# Patient Record
Sex: Female | Born: 1945
Health system: Southern US, Community
[De-identification: ages and names within clinical notes are randomized; demographics above are authoritative.]

## PROBLEM LIST (undated history)

## (undated) DIAGNOSIS — H269 Unspecified cataract: Secondary | ICD-10-CM

## (undated) DIAGNOSIS — R51 Headache: Secondary | ICD-10-CM

## (undated) DIAGNOSIS — M1990 Unspecified osteoarthritis, unspecified site: Secondary | ICD-10-CM

## (undated) DIAGNOSIS — R112 Nausea with vomiting, unspecified: Secondary | ICD-10-CM

## (undated) DIAGNOSIS — Z9889 Other specified postprocedural states: Secondary | ICD-10-CM

## (undated) DIAGNOSIS — H3530 Unspecified macular degeneration: Secondary | ICD-10-CM

## (undated) DIAGNOSIS — D649 Anemia, unspecified: Secondary | ICD-10-CM

## (undated) DIAGNOSIS — T4145XA Adverse effect of unspecified anesthetic, initial encounter: Secondary | ICD-10-CM

## (undated) DIAGNOSIS — M5030 Other cervical disc degeneration, unspecified cervical region: Secondary | ICD-10-CM

## (undated) DIAGNOSIS — K219 Gastro-esophageal reflux disease without esophagitis: Secondary | ICD-10-CM

## (undated) DIAGNOSIS — M810 Age-related osteoporosis without current pathological fracture: Secondary | ICD-10-CM

## (undated) DIAGNOSIS — T7840XA Allergy, unspecified, initial encounter: Secondary | ICD-10-CM

## (undated) DIAGNOSIS — F32A Depression, unspecified: Secondary | ICD-10-CM

## (undated) DIAGNOSIS — M5430 Sciatica, unspecified side: Secondary | ICD-10-CM

## (undated) DIAGNOSIS — F419 Anxiety disorder, unspecified: Secondary | ICD-10-CM

## (undated) DIAGNOSIS — G5602 Carpal tunnel syndrome, left upper limb: Secondary | ICD-10-CM

## (undated) HISTORY — DX: Allergy, unspecified, initial encounter: T78.40XA

## (undated) HISTORY — DX: Unspecified cataract: H26.9

## (undated) HISTORY — DX: Unspecified osteoarthritis, unspecified site: M19.90

## (undated) HISTORY — DX: Anemia, unspecified: D64.9

## (undated) HISTORY — DX: Gastro-esophageal reflux disease without esophagitis: K21.9

## (undated) HISTORY — DX: Depression, unspecified: F32.A

## (undated) HISTORY — DX: Unspecified macular degeneration: H35.30

## (undated) HISTORY — DX: Age-related osteoporosis without current pathological fracture: M81.0

## (undated) HISTORY — DX: Anxiety disorder, unspecified: F41.9

---

## 1974-05-03 HISTORY — PX: TUBAL LIGATION: SHX77

## 1999-09-15 ENCOUNTER — Ambulatory Visit (HOSPITAL_COMMUNITY): Admission: RE | Admit: 1999-09-15 | Discharge: 1999-09-15 | Payer: Self-pay | Admitting: Orthopaedic Surgery

## 2001-10-26 ENCOUNTER — Ambulatory Visit (HOSPITAL_COMMUNITY): Admission: RE | Admit: 2001-10-26 | Discharge: 2001-10-26 | Payer: Self-pay | Admitting: Obstetrics & Gynecology

## 2001-10-26 ENCOUNTER — Encounter: Payer: Self-pay | Admitting: Obstetrics & Gynecology

## 2001-10-27 ENCOUNTER — Ambulatory Visit (HOSPITAL_COMMUNITY): Admission: RE | Admit: 2001-10-27 | Discharge: 2001-10-27 | Payer: Self-pay | Admitting: Obstetrics & Gynecology

## 2001-10-27 ENCOUNTER — Encounter: Payer: Self-pay | Admitting: Obstetrics & Gynecology

## 2002-08-31 ENCOUNTER — Encounter: Payer: Self-pay | Admitting: Emergency Medicine

## 2002-08-31 ENCOUNTER — Emergency Department (HOSPITAL_COMMUNITY): Admission: EM | Admit: 2002-08-31 | Discharge: 2002-08-31 | Payer: Self-pay | Admitting: Emergency Medicine

## 2002-09-07 ENCOUNTER — Ambulatory Visit (HOSPITAL_COMMUNITY): Admission: RE | Admit: 2002-09-07 | Discharge: 2002-09-07 | Payer: Self-pay | Admitting: Obstetrics & Gynecology

## 2002-10-30 ENCOUNTER — Encounter: Payer: Self-pay | Admitting: Obstetrics & Gynecology

## 2002-10-30 ENCOUNTER — Ambulatory Visit (HOSPITAL_COMMUNITY): Admission: RE | Admit: 2002-10-30 | Discharge: 2002-10-30 | Payer: Self-pay | Admitting: Obstetrics & Gynecology

## 2003-10-31 ENCOUNTER — Ambulatory Visit (HOSPITAL_COMMUNITY): Admission: RE | Admit: 2003-10-31 | Discharge: 2003-10-31 | Payer: Self-pay | Admitting: Obstetrics & Gynecology

## 2004-05-08 ENCOUNTER — Ambulatory Visit (HOSPITAL_COMMUNITY): Admission: RE | Admit: 2004-05-08 | Discharge: 2004-05-08 | Payer: Self-pay | Admitting: Family Medicine

## 2004-10-28 ENCOUNTER — Encounter: Admission: RE | Admit: 2004-10-28 | Discharge: 2004-10-28 | Payer: Self-pay | Admitting: Orthopedic Surgery

## 2004-11-02 ENCOUNTER — Ambulatory Visit (HOSPITAL_COMMUNITY): Admission: RE | Admit: 2004-11-02 | Discharge: 2004-11-02 | Payer: Self-pay | Admitting: Obstetrics & Gynecology

## 2004-11-23 ENCOUNTER — Encounter: Admission: RE | Admit: 2004-11-23 | Discharge: 2004-11-23 | Payer: Self-pay | Admitting: Orthopedic Surgery

## 2005-02-10 ENCOUNTER — Encounter: Admission: RE | Admit: 2005-02-10 | Discharge: 2005-02-10 | Payer: Self-pay | Admitting: Orthopedic Surgery

## 2005-02-24 ENCOUNTER — Encounter: Admission: RE | Admit: 2005-02-24 | Discharge: 2005-02-24 | Payer: Self-pay | Admitting: Orthopedic Surgery

## 2005-03-09 ENCOUNTER — Observation Stay (HOSPITAL_COMMUNITY): Admission: AD | Admit: 2005-03-09 | Discharge: 2005-03-10 | Payer: Self-pay | Admitting: Family Medicine

## 2005-03-09 ENCOUNTER — Ambulatory Visit: Payer: Self-pay | Admitting: *Deleted

## 2005-09-03 ENCOUNTER — Ambulatory Visit (HOSPITAL_COMMUNITY): Admission: RE | Admit: 2005-09-03 | Discharge: 2005-09-03 | Payer: Self-pay | Admitting: Internal Medicine

## 2005-09-03 ENCOUNTER — Ambulatory Visit: Payer: Self-pay | Admitting: Internal Medicine

## 2005-11-04 ENCOUNTER — Ambulatory Visit (HOSPITAL_COMMUNITY): Admission: RE | Admit: 2005-11-04 | Discharge: 2005-11-04 | Payer: Self-pay | Admitting: Family Medicine

## 2006-11-17 ENCOUNTER — Ambulatory Visit (HOSPITAL_COMMUNITY): Admission: RE | Admit: 2006-11-17 | Discharge: 2006-11-17 | Payer: Self-pay | Admitting: Obstetrics & Gynecology

## 2007-05-30 ENCOUNTER — Inpatient Hospital Stay (HOSPITAL_COMMUNITY): Admission: EM | Admit: 2007-05-30 | Discharge: 2007-06-01 | Payer: Self-pay | Admitting: Emergency Medicine

## 2007-05-30 ENCOUNTER — Encounter (INDEPENDENT_AMBULATORY_CARE_PROVIDER_SITE_OTHER): Payer: Self-pay | Admitting: General Surgery

## 2007-10-16 ENCOUNTER — Encounter: Admission: RE | Admit: 2007-10-16 | Discharge: 2007-10-16 | Payer: Self-pay | Admitting: Orthopedic Surgery

## 2007-10-26 ENCOUNTER — Encounter: Admission: RE | Admit: 2007-10-26 | Discharge: 2007-10-26 | Payer: Self-pay | Admitting: Orthopedic Surgery

## 2007-11-21 ENCOUNTER — Ambulatory Visit (HOSPITAL_COMMUNITY): Admission: RE | Admit: 2007-11-21 | Discharge: 2007-11-21 | Payer: Self-pay | Admitting: Obstetrics & Gynecology

## 2007-11-24 ENCOUNTER — Encounter: Admission: RE | Admit: 2007-11-24 | Discharge: 2007-11-24 | Payer: Self-pay | Admitting: Neurosurgery

## 2008-05-03 HISTORY — PX: CARPAL TUNNEL RELEASE: SHX101

## 2008-05-03 HISTORY — PX: APPENDECTOMY: SHX54

## 2008-07-15 ENCOUNTER — Ambulatory Visit (HOSPITAL_COMMUNITY): Admission: RE | Admit: 2008-07-15 | Discharge: 2008-07-15 | Payer: Self-pay | Admitting: Family Medicine

## 2008-12-26 ENCOUNTER — Other Ambulatory Visit: Admission: RE | Admit: 2008-12-26 | Discharge: 2008-12-26 | Payer: Self-pay | Admitting: Obstetrics & Gynecology

## 2008-12-28 ENCOUNTER — Emergency Department (HOSPITAL_COMMUNITY): Admission: EM | Admit: 2008-12-28 | Discharge: 2008-12-28 | Payer: Self-pay | Admitting: Emergency Medicine

## 2009-05-03 HISTORY — PX: EYE SURGERY: SHX253

## 2010-05-24 ENCOUNTER — Encounter: Payer: Self-pay | Admitting: Orthopedic Surgery

## 2010-08-08 LAB — URINE CULTURE: Colony Count: 50000

## 2010-08-08 LAB — CBC
HCT: 39 % (ref 36.0–46.0)
Hemoglobin: 13.6 g/dL (ref 12.0–15.0)
RBC: 4.24 MIL/uL (ref 3.87–5.11)

## 2010-08-08 LAB — LIPASE, BLOOD: Lipase: 24 U/L (ref 11–59)

## 2010-08-08 LAB — DIFFERENTIAL
Eosinophils Relative: 1 % (ref 0–5)
Lymphocytes Relative: 43 % (ref 12–46)
Monocytes Absolute: 0.7 10*3/uL (ref 0.1–1.0)
Monocytes Relative: 11 % (ref 3–12)
Neutro Abs: 2.7 10*3/uL (ref 1.7–7.7)

## 2010-08-08 LAB — URINALYSIS, ROUTINE W REFLEX MICROSCOPIC
Bilirubin Urine: NEGATIVE
Hgb urine dipstick: NEGATIVE
Ketones, ur: NEGATIVE mg/dL
Nitrite: NEGATIVE
Specific Gravity, Urine: 1.015 (ref 1.005–1.030)
Urobilinogen, UA: 0.2 mg/dL (ref 0.0–1.0)

## 2010-08-08 LAB — HEPATIC FUNCTION PANEL
Albumin: 4.3 g/dL (ref 3.5–5.2)
Total Bilirubin: 0.5 mg/dL (ref 0.3–1.2)
Total Protein: 6.8 g/dL (ref 6.0–8.3)

## 2010-08-08 LAB — BASIC METABOLIC PANEL
CO2: 31 mEq/L (ref 19–32)
Calcium: 9.6 mg/dL (ref 8.4–10.5)
GFR calc Af Amer: >60 mL/min (ref >60)
GFR calc non Af Amer: >60 mL/min (ref >60)
Potassium: 4.2 mEq/L (ref 3.5–5.1)
Sodium: 138 mEq/L (ref 135–145)

## 2010-08-08 LAB — URINE MICROSCOPIC-ADD ON

## 2010-09-15 NOTE — Op Note (Signed)
Ariel Cook, Ariel Cook                ACCOUNT NO.:  [id number]   MEDICAL RECORD NO.:  [id number]          PATIENT TYPE:  INP   LOCATION:  A307                          FACILITY:  [facility]   PHYSICIAN:  Barbaraann Barthel, M.D. DATE OF BIRTH:  November 03, 1945   DATE OF PROCEDURE:  05/30/2007  DATE OF DISCHARGE:                               OPERATIVE REPORT   SURGEON:  Barbaraann Barthel, M.D.   PRE AND POSTOPERATIVE DIAGNOSIS:  Acute appendicitis.   PROCEDURE:  Open appendectomy.   SPECIMEN:  Appendix.   NOTE:  This is 65 year old white female who presented to the hospital at  the emergency room with less than 12-hour history of nausea and right  lower quadrant pain.  CT scan revealed acute appendicitis.  We took her  to surgery after discussing complications not limited to but including  bleeding, infection and appendiceal leak.  Informed consent was  obtained.   GROSS OPERATIVE FINDINGS:  The patient had the terminal portion of the  appendix appeared to be suppurative and it was nonperforated.  It was  adhered to the lateral aspect of the cecum.  The terminal ileum appeared  to be normal and the right lower quadrant otherwise was appeared to be  normal.   TECHNIQUE:  The patient was placed in supine position.  After the  adequate administration of general anesthesia via endotracheal  intubation a Foley catheter was aseptically inserted and a low  transverse incision was carried out over the right lower quadrant.  The  skin, subcutaneous tissue was excised, the fascia was excised over the  muscle.  The muscle was retracted medially and the peritoneum was  grasped with a two clamps and opened and the appendix was delivered into  the wound after dissecting it free from the cecum.  The adhesions around  it were carefully teased away from the cecum in order to avoid any  injury.  The mesoappendix was ligated with 2-0 silk and 3-0 Polysorb as  needed and silver clipped as well.  The appendix was  then amputated with  a TA-30 stapling device.  The right lower quadrant was then irrigated.  Peritoneum was closed with a running 0-0 Polysorb suture and then the  fascia was closed with interrupted figure-of-eight. Subcu was irrigated,  the skin was approximated with stapling device.  Prior to closing the  skin, I anesthetize the skin with approximately 10 mL of 0.5%  Sensorcaine to help with postoperative  comfort.  Prior to closure all sponge, needle, instrument counts found  to be correct.  Estimated blood loss was minimal.  The patient received  1100 mL of crystalloids intraoperatively.  No drains were placed.  There  were no complications.      Barbaraann Barthel, M.D.  Electronically Signed     WB/MEDQ  D:  05/30/2007  T:  05/30/2007  Job:  [id number]

## 2010-09-15 NOTE — Consult Note (Signed)
Ariel Cook, Ariel Cook                ACCOUNT NO.:  [id number]   MEDICAL RECORD NO.:  [id number]          PATIENT TYPE:  INP   LOCATION:  A307                          FACILITY:  [facility]   PHYSICIAN:  Barbaraann Barthel, M.D. DATE OF BIRTH:  1946/03/14   DATE OF CONSULTATION:  DATE OF DISCHARGE:                                 CONSULTATION   REASON FOR CONSULTATION:  Surgery was asked to see this 65 year old  white female with right lower quadrant pain.   HISTORY OF PRESENT MEDICAL ILLNESS:  She developed some right lower  quadrant pain approximately 5:00 or 5:30 in the afternoon of May 29, 2007.  This increased and she had considerable nausea.  She came to the  emergency room around 9:00 p.m., and Surgery was called around 1:30.  As  this patient had less than 12-hour history of pain, a normal white  count, and significant history of asthma in the past with difficulties  recovering from surgery due to her breathing problems, I placed her on  antibiotics with plans for surgery as soon as possible in the morning  with anesthesia available.  We discussed the surgery in detail with the  patient discussing complications not limited to but including bleeding,  infection, and appendiceal stump leak.  We also discussed the  possibility that a drain may be required.  Informed consent was  obtained.   PHYSICAL EXAMINATION:  VITAL SIGNS:  The patient is 5 feet tall and  weighs 150 pounds.  Her temperature is 99, heart rate 93 per minute,  respirations 18, blood pressure 118/71.  HEENT:  Head is normocephalic.  Eyes:  Extraocular movements intact.  Pupils were round and react to light and accommodation.  There was no  conjunctive pallor or scleral injection.  Sclerae are of normal  tincture.  NECK:  Cylindrical.  There is no jugular vein distention, thyromegaly,  tracheal deviation, and no bruits are auscultated.  LUNGS:  Chest is clear.  I do not hear any wheezing.  HEART:  Regular rhythm.  CHEST:  Breasts and axilla are without masses.  ABDOMEN:  Bowel sounds are diminished with exquisite tenderness in the  right lower quadrant with localized rebound.  No femoral or inguinal  hernias are appreciated.  RECTAL:  The stool is guaiac negative.  PELVIC:  Pelvic was deferred.  EXTREMITIES:  Within normal limits.   REVIEW OF SYSTEMS:  OB/GYN HISTORY:  Gravida 2, para 2, abortus 0,  cesarean 0 family.  FAMILY HISTORY:  Breast carcinoma, her sister had carcinoma of breast.  She has followed  closely with her mammograms and in 2008, her last mammogram was  negative.  PAST SURGICAL HISTORY:  History of tubal ligation and endometrial ablation procedure.  GU  HISTORY:  History of urinary tract infections in the past.  No history  of nephrolithiasis.  CARDIORESPIRATORY SYSTEM:  The patient is a  nonsmoker.  She does, however, have a history of asthma and takes an  albuterol inhaler.  It has been sometime since she has had an asthma  attack, and she states that she had problems  after surgery, breathing  problems, this irritated her asthma.  ENDOCRINE HISTORY:  No history of  diabetes or thyroid disease. MUSCULOSKELETAL SYSTEM:  She has  degenerative spine disease affecting the cervical spine and the back.  GI SYSTEM:  Nausea, right lower quadrant pain.  No bright red rectal  bleeding, black tarry stools, or change in bowel habits.  No history of  inflammatory bowel disease or hepatitis.   MEDICATIONS:  She takes a vitamin supplements and albuterol inhaler on a  p.r.n. basis.   ALLERGIES:  SHE IS ALLERGIC TO CODEINE AND SULFA.  THESE CAUSE RASHES  AND BREATHING PROBLEMS.   LABORATORY DATA:  CT shows a 10 mm appendix with inflammatory changes  consistent with acute appendicitis with no rupture.  Her white count is  10,000 with an H&H of 12.9 and 37.9.  She has 76% neutrophils.  Urinalysis is grossly within normal limits and metabolic-7 is likewise  within normal limits.   PAST  SURGERIES:  The patient has had cataract surgery, tubal ligation,  endocrine ablation, and carpal tunnel on the right wrist.   IMPRESSION:  Ariel Cook is a 65 year old white female with who has a  history of asthma who presents with signs and symptoms of acute  appendicitis, and we will plan for a surgery as soon as possible.  We  have discussed this in detail with both her and her sister.  Informed  consent was obtained.      Barbaraann Barthel, M.D.  Electronically Signed     WB/MEDQ  D:  05/30/2007  T:  05/30/2007  Job:  [id number]   cc:   Barbaraann Barthel, M.D.  Fax: [phone]   Emergency Room

## 2010-09-15 NOTE — Discharge Summary (Signed)
[name], Ariel Cook                ACCOUNT NO.:  [id number]   MEDICAL RECORD NO.:  [id number]          PATIENT TYPE:  INP   LOCATION:  A307                          FACILITY:  [facility]   PHYSICIAN:  Barbaraann Barthel, M.D. DATE OF BIRTH:  May 26, 1945   DATE OF ADMISSION:  05/29/2007  DATE OF DISCHARGE:  01/29/2009LH                               DISCHARGE SUMMARY   DATE OF SURGERY:  May 30, 2007.   PROCEDURE:  Open appendectomy.   This is a 65 year old white female who presented to the hospital  emergency room with a less than 12-hour history of nausea and right  lower quadrant pain.  A CT scan revealed acute appendicitis.  We took  her to surgery after discussing the complications of the procedure.  We  found an acute suppurative, nonperforated appendix that was very adhered  to the cecum however, there was no perforation.  The patient tolerated  the procedure well and she was discharged on essentially the second and  a half postoperative day.  At the time of discharge, her wound was  clean.  She was tolerating p.o. and she was moving her bowels.  She had  no leg pain, dysuria or shortness of breath.  Her hospital course was  completely benign.  Her diet and activity were advanced as tolerated and  she did well.  She was discharged without any problems essentially on  the second postoperative day.   LABORATORY DATA:  She was admitted with an H&H of 10.5 and an H&H of  12.9 and 37.4 with 76% neutrophils and her white count on May 31, 2007 was 8.5 with an H&H of 11.8 and 33.9.  Her electrolytes were within  normal limits.   CT scan results were as mentioned above.   Her hospital course was completely benign.  Her discharge instructions  are as follows.  She is to resume her preoperative medications.  She is  discharged on a full liquid and soft diet.  She is told clean her wound  with alcohol three times a day.  She is told not to take any harsh  laxatives.  She is to take no  aspirin.  1. Tylenol 2 tablets every 4 hours as needed for pain.  2. Colace 100 mg daily two times a day.  3. She is to continue take Cipro 500 mg two times a day p.o. for 3      days.  4. She is told to resume her preoperative medications.   We will follow her up in the office on Thursday, February 5, I believe  at 10:00 a.m. and she is told to call should she have any acute problems  or go to the emergency room.     Barbaraann Barthel, M.D.  Electronically Signed    WB/MEDQ  D:  06/01/2007  T:  06/02/2007  Job:  [id number]

## 2010-09-15 NOTE — Discharge Summary (Signed)
[name], [name]                ACCOUNT NO.:  [id number]   MEDICAL RECORD NO.:  [id number]          PATIENT TYPE:  INP   LOCATION:  A307                          FACILITY:  [facility]   PHYSICIAN:  Barbaraann Barthel, M.D. DATE OF BIRTH:  01-10-1946   DATE OF ADMISSION:  05/29/2007  DATE OF DISCHARGE:  01/29/2009LH                               DISCHARGE SUMMARY   DIAGNOSIS:  Acute appendicitis.   PROCEDURE:  On May 30, 2007 open appendectomy.   NOTE:  This is a 65 year old white female who was admitted through the  emergency room with signs and symptoms of acute appendicitis.   Dictation ended at this point.      Barbaraann Barthel, M.D.     WB/MEDQ  D:  06/01/2007  T:  06/02/2007  Job:  [id number]

## 2010-09-18 NOTE — Procedures (Signed)
[name], Ariel Cook                ACCOUNT NO.:  [id number]   MEDICAL RECORD NO.:  [id number]          PATIENT TYPE:  OBV   LOCATION:  A227                          FACILITY:  [facility]   PHYSICIAN:  Donna Bernard, M.D.DATE OF BIRTH:  24-Jul-1945   DATE OF PROCEDURE:  03/09/2005  DATE OF DISCHARGE:  03/10/2005                                EKG INTERPRETATION   Electrocardiogram reveals normal sinus rhythm with no significant ST-T  changes.      Donna Bernard, M.D.  Electronically Signed     WSL/MEDQ  D:  05/10/2005  T:  05/11/2005  Job:  [id number]

## 2010-09-18 NOTE — Op Note (Signed)
   NAME:  Ariel Cook, Ariel Cook                          ACCOUNT NO.:  [id number]   MEDICAL RECORD NO.:  [id number]                   PATIENT TYPE:  AMB   LOCATION:  DAY                                  FACILITY:  [facility]   PHYSICIAN:  Lazaro Arms, M.D.                DATE OF BIRTH:  January 13, 1946   DATE OF PROCEDURE:  09/07/2002  DATE OF DISCHARGE:                                 OPERATIVE REPORT   PREOPERATIVE DIAGNOSIS:  Menometrorrhagia.   POSTOPERATIVE DIAGNOSIS:  Menometrorrhagia.   PROCEDURE:  Hysteroscopy, D&C, with endometrial ablation.   SURGEON:  Lazaro Arms, M.D.   ANESTHESIA:  General endotracheal.   FINDINGS:  The patient came to the office complaining of prolonged vaginal  bleeding for 19 straight days.  She is 56, almost 65 years old and has been  having regular periods.  Because of the concern of her perimenopausal  status, we proceeded with a hysteroscopy, D&C, and endometrial ablation.  The findings were negative.  There were no abnormalities in the endometrium  whatsoever.   DESCRIPTION OF PROCEDURE:  The patient was taken to the operating room and  placed in the supine position where she underwent general endotracheal  anesthesia.  She was placed in the low lithotomy position and prepped and  draped in the usual sterile fashion.  The bladder was drained.   A sterile speculum was placed.  Her cervix was grasped with a single-tooth  tenaculum, and 0.5% Marcaine with 1:200,000 epinephrine was given as a  paracervical block.  The cervix was dilated serially.  The uterus sounded to  9 cm.  Hysteroscopy was performed and was found to be normal.  The uterus  was curetted vigorously, with uterine cry in all areas.  We then performed  endometrial ablation using the ThermaChoice catheter.  We took it to a  negative pressure of -150.  We then used approximately 16 cc of D5W and  distended the balloon to a pressure of 175 mmHg to heat it up to 87 degrees  Celsius for a total  therapy time of 9 minutes and 18 seconds.  The patient  tolerated the procedure well.  She experienced minimal blood loss and was  taken to the recovery room in good and stable condition.  All counts were  correct.                                               Lazaro Arms, M.D.    Loraine Maple  D:  09/07/2002  T:  09/07/2002  Job:  [id number]

## 2010-09-18 NOTE — Op Note (Signed)
Ariel Cook, Ariel Cook                ACCOUNT NO.:  [id number]   MEDICAL RECORD NO.:  [id number]          PATIENT TYPE:  AMB   LOCATION:  DAY                           FACILITY:  [facility]   PHYSICIAN:  Lionel December, M.D.    DATE OF BIRTH:  March 30, 1946   DATE OF PROCEDURE:  09/03/2005  DATE OF DISCHARGE:                                 OPERATIVE REPORT   PROCEDURE:  Colonoscopy.   INDICATIONS:  [name] is 65 year old Caucasian female who is undergoing  average risk screening colonoscopy.  The procedure is reviewed with the  patient and informed consent was obtained.   MEDS FOR CONSCIOUS SEDATION:  Fentanyl 25 mcg IV, Versed 4 mg IV.   FINDINGS:  The procedure was performed in the endoscopy suite.  The  patient's vital signs and O2 sat were monitored during procedure and  remained stable.  The patient was placed in the left lateral position and  rectal examination performed.  No abnormality noted on external or digital  exam.  The Olympus videoscope was placed in the rectum and advanced under  vision in the sigmoid colon and beyond.  The preparation was excellent.  The  scope was passed into cecum which was identified by appendiceal orifice and  ileocecal valve.  As the scope was withdrawn, the colonic mucosa was  carefully examined and was normal throughout.  Rectal mucosa, similarly, was  normal.  The scope was retroflexed to examine anorectal junction which was  unremarkable.  The endoscope was then straightened and withdrawn.  The  patient tolerated the procedure well.   FINAL DIAGNOSIS:  Normal colonoscopy.   RECOMMENDATIONS:  1.  She will resume her usual medicine diet.  2.  Y early Hemoccults and she may consider next screening exam in ten years      from now.      Lionel December, M.D.  Electronically Signed     NR/MEDQ  D:  09/03/2005  T:  09/03/2005  Job:  [id number]   cc:   Donna Bernard, M.D.  Fax: [phone]

## 2010-09-18 NOTE — Consult Note (Signed)
Ariel Cook, Ariel Cook                ACCOUNT NO.:  [id number]   MEDICAL RECORD NO.:  [id number]          PATIENT TYPE:  OBV   LOCATION:  A227                          FACILITY:  [facility]   PHYSICIAN:  Cecil Cranker, M.D.DATE OF BIRTH:  November 17, 1945   DATE OF CONSULTATION:  03/09/2005  DATE OF DISCHARGE:                                   CONSULTATION   REFERRING PHYSICIAN:  Donna Bernard, M.D.   HISTORY OF PRESENT ILLNESS:  Ariel Cook is a 65 year old female with past  medical history significant for asthma and chronic back problems who reports  onset of severe anterior chest discomfort at rest today.  She states she was  at her lawyer's office standing and talking with someone when she noted  gradual onset of severe pressure in her anterior chest that radiated up into  her neck bilaterally.  She denied any associated shortness of breath,  nausea, vomiting or diaphoresis.  She denied any alleviating or aggravating  factors.  She reports the pain gradually eased off once she reached Dr.  Fletcher Anon office.  She does report one previous episode of similar chest  discomfort several years ago.  She was evaluated in the emergency department  and released.  She does report chronic dyspnea on exertion that she relates  to her asthma.  She also reports occasional mild chest discomfort when she  is walking.   PAST MEDICAL HISTORY:  1.  Asthma since age 7 with no previous hospitalizations for this.  2.  Status post D&C and endometrial ablation in 2004.  3.  Status post partial hysterectomy.  4.  History of chronic back pain secondary to three bulging discs and one      disintegrated disc.  5.  She has received several epidural steroid injections for treatment.  6.  Status post cataract removal bilaterally.  7.  Status post bilateral tubal ligation in 1976.  8.  History of scoliosis.   ALLERGIES:  CODEINE.  SULFA.  Question of PAIN MEDICINE.   MEDICATIONS PRIOR TO ADMISSION:  1.   Albuterol as needed.  2.  Tylenol PM nightly.   SOCIAL HISTORY:  Ariel Cook lives in [place] with her husband.  She is  married.  She has two sons, one with cerebral palsy and lives in a group  home and one who lives in [place], [place], and has two children of  his own.  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  She denies any history of tobacco abuse.  No  alcohol use.  No illicit drug use.  She walks approximately 2 miles per day.  She is a vegetarian.   FAMILY HISTORY:  Mother died at 31 years old secondary to lung disease.  She  has a history of angina.  Father died at 18 years old secondary to MI.  She  has two sister living, one with a small hole in her heart, and one with  diabetes and hypertension.   REVIEW OF SYSTEMS:  CONSTITUTIONAL:  No fevers or chills.  HEENT:  She has  had some upper respiratory symptoms over the last several weeks including  sore throat and congestion.  She has occasional vertigo and she states she  is lightheaded all the time.  SKINS:  No rashes or lesions.  CARDIOPULMONARY:  As per HPI.  Positive for cough and wheezing x6 weeks.  Chronic peripheral edema and occasional palpitations.  She also reports  chronic orthopnea.  GU:  No frequency or dysuria.  NEUROLOGIC:  Numbness in  her left leg occasionally secondary to a back problem.  MUSCULOSKELETAL:  She states she has arthralgias all over, mostly in her back.  She gets  frequent cramps in her legs at night.  GASTROINTESTINAL:  No nausea,  vomiting or diarrhea.  No bright red blood per rectum.  No melena.  All of  the systems reviewed are negative except for as in HPI.   PHYSICAL EXAMINATION:  VITAL SIGNS:  Temperature 98.3, pulse 59,  respirations 19, blood pressure 122/73.  Weight 147 pounds.  GENERAL:  Well-developed, well-nourished female in no acute distress.  NECK:  No jugular venous distention, no carotid bruits are auscultated.  CARDIAC:   Regular rate and rhythm, normal S1, S2.  No murmurs are  appreciated.  LUNGS:  Clear to auscultation.  ABDOMEN:  Soft and nontender with active bowel sounds.  No  hepatosplenomegaly is noted.  EXTREMITIES:  Femoral pulses intact bilaterally.  No femoral bruits are  noted.  Distal pulses intact bilaterally.  Trace peripheral edema noted.  NEUROLOGIC:  She is alert and oriented x3, grossly nonfocal.  SKIN:  Warm and dry with no obvious rashes noted.   LABORATORY DATA AND X-RAY FINDINGS:  EKG reveals a sinus rhythm at 60 beats  per minute, normal PR interval, QSR and normal QTC.  She has some  nonspecific ST abnormalities.  Chest x-ray is also pending.   CBC reveals a white blood cell count of 7, hemoglobin 14.6, hematocrit 42.7,  platelets 341.  Metabolic panel is pending.  Cardiac enzymes are pending at  the time of this dictation.   IMPRESSION:  Chest discomfort with some typical features for coronary  ischemia concerning.   RECOMMENDATIONS:  The patient will have serial cardiac enzymes to rule out  acute myocardial infarction.  If her enzymes remain negative and she has no  further chest discomfort, she will be evaluated with a stress Myoview in the  morning.  If she has abnormal cardiac enzymes or return of her chest  discomfort, she will need to have further evaluation with cardiac  catheterization at [facility].   We appreciate this consult and will be happy to follow this patient along  with you.  The patient was interviewed and examined by Dr. Corinda Gubler and he  agrees with the above assessment and plan.      Amy Mercy Riding, P.A. [facility]    ______________________________  E. Graceann Congress, M.D.    AB/MEDQ  D:  03/09/2005  T:  03/09/2005  Job:  [id number]

## 2010-09-18 NOTE — H&P (Signed)
Ariel Cook, Ariel Cook                ACCOUNT NO.:  [id number]   MEDICAL RECORD NO.:  [id number]          PATIENT TYPE:  OBV   LOCATION:  A227                          FACILITY:  [facility]   PHYSICIAN:  Donna Bernard, M.D.DATE OF BIRTH:  [birth year]   DATE OF ADMISSION:  03/09/2005  DATE OF DISCHARGE:  LH                                HISTORY & PHYSICAL   CHIEF COMPLAINT:  Chest pain.   HISTORY OF PRESENT ILLNESS:  This patient is a 65 year old white female with  fairly benign prior medical history who presented to the office the day of  admission with complaints of severe chest pain.  She described this as  substernal, deep pressure, ache that was very severe in nature.  It radiated  to her throat.  She had no nausea, no shortness of breath, no diaphoresis.  She does have chronic asthma for which she uses Ventolin spray  intermittently for this although often the Ventolin spray triggers excessive  heart rate.  The patient has been under a lot of increased stress recently,  working on the probate of her father's estate.  In fact, she was in the  lawyer's office having a discussion with him about that when she developed  this discomfort.  The patient also notes symptoms of reflux intermittently.  In addition, she does exercise and usually does not get pain with this.  There is a family history of coronary artery disease, though, including her  father.   SOCIAL HISTORY:  The patient does not smoke.  She is married.  No  significant alcohol intake.   ALLERGIES:  Please see chart.   PAST SURGICAL HISTORY:  Remote endometrial ablation, remote cataract  surgery, epidural injections to the back recently via Dr. Charlett Blake.   CURRENT MEDICATIONS:  1.  Ventolin two sprays PRN.  2.  Tylenol PRN for headaches.   PAST MEDICAL HISTORY:  Significant for that noted above plus recurrent back  pain currently followed by a specialist for this.  Also patient notes  chronic migraine headaches for which  she uses Tylenol.  Patient is  experiencing headaches more frequently these days.   FAMILY HISTORY:  As noted, positive for coronary artery disease.   REVIEW OF SYSTEMS:  Otherwise negative.   PHYSICAL EXAMINATION:  VITAL SIGNS:  Blood pressure 136/80, afebrile.  GENERAL APPEARANCE:  No acute distress.  HEENT:  Normal.  NECK:  Supple.  LUNGS:  Clear.  HEART:  Regular rate and rhythm.  Chest wall nontender.  ABDOMEN:  Nontender.  EXTREMITIES:  Normal.  NEUROLOGICAL:  Intact.   CLINICAL DATA:  Electrocardiogram normal sinus rhythm.  No significant ST-T  changes.  Cardiac enzymes pending.   IMPRESSION:  1.  Chest pain, atypical in nature, however, with severity along with risk      factors, along with the nature of the pain, further evaluation is      warranted.  2.  Asthma.  3.  Migraine headaches.   PLAN:  Admit to hospital, serial cardiac enzymes, telemetry, cardiology  consultation, chest x-ray, Lovenox administration, further orders as noted  on  the chart.      Donna Bernard, M.D.  Electronically Signed     WSL/MEDQ  D:  03/10/2005  T:  03/10/2005  Job:  [id number]

## 2010-12-11 ENCOUNTER — Other Ambulatory Visit: Payer: Self-pay | Admitting: Ophthalmology

## 2011-01-21 LAB — CBC
HCT: 33.9 — ABNORMAL LOW
Hemoglobin: 11.8 — ABNORMAL LOW
Hemoglobin: 12.9
MCV: 93.8
MCV: 95.1
RBC: 3.57 — ABNORMAL LOW
RBC: 3.99
WBC: 10.5
WBC: 8.5

## 2011-01-21 LAB — DIFFERENTIAL
Eosinophils Absolute: 0
Eosinophils Absolute: 0.1
Eosinophils Relative: 0
Lymphs Abs: 1.8
Lymphs Abs: 1.9
Monocytes Relative: 5
Monocytes Relative: 8
Neutrophils Relative %: 76

## 2011-01-21 LAB — URINALYSIS, ROUTINE W REFLEX MICROSCOPIC
Glucose, UA: NEGATIVE
Hgb urine dipstick: NEGATIVE
Protein, ur: NEGATIVE
Specific Gravity, Urine: 1.01

## 2011-01-21 LAB — BASIC METABOLIC PANEL
Chloride: 102
Chloride: 111
Creatinine, Ser: 0.76
GFR calc Af Amer: >60
GFR calc Af Amer: >60
Potassium: 3.8
Potassium: 4.1
Sodium: 137

## 2011-01-21 LAB — LIPASE, BLOOD: Lipase: 24

## 2011-02-24 ENCOUNTER — Other Ambulatory Visit: Payer: Self-pay | Admitting: Family Medicine

## 2011-02-24 DIAGNOSIS — Z139 Encounter for screening, unspecified: Secondary | ICD-10-CM

## 2011-03-01 ENCOUNTER — Ambulatory Visit (HOSPITAL_COMMUNITY)
Admission: RE | Admit: 2011-03-01 | Discharge: 2011-03-01 | Disposition: A | Discharge status: Home / Self Care | Payer: BC Managed Care – PPO | Source: Ambulatory Visit | Attending: Family Medicine | Admitting: Family Medicine

## 2011-03-01 ENCOUNTER — Other Ambulatory Visit: Payer: Self-pay | Admitting: Family Medicine

## 2011-03-01 DIAGNOSIS — R05 Cough: Secondary | ICD-10-CM | POA: Insufficient documentation

## 2011-03-01 DIAGNOSIS — Z139 Encounter for screening, unspecified: Secondary | ICD-10-CM

## 2011-03-01 DIAGNOSIS — R059 Cough, unspecified: Secondary | ICD-10-CM | POA: Insufficient documentation

## 2011-03-01 DIAGNOSIS — Z1231 Encounter for screening mammogram for malignant neoplasm of breast: Secondary | ICD-10-CM | POA: Insufficient documentation

## 2011-03-01 DIAGNOSIS — R079 Chest pain, unspecified: Secondary | ICD-10-CM | POA: Insufficient documentation

## 2011-08-16 ENCOUNTER — Other Ambulatory Visit: Payer: Self-pay | Admitting: Orthopedic Surgery

## 2011-09-01 ENCOUNTER — Encounter (HOSPITAL_BASED_OUTPATIENT_CLINIC_OR_DEPARTMENT_OTHER): Payer: Self-pay | Admitting: *Deleted

## 2011-09-02 ENCOUNTER — Encounter (HOSPITAL_BASED_OUTPATIENT_CLINIC_OR_DEPARTMENT_OTHER): Payer: Self-pay | Admitting: *Deleted

## 2011-09-03 ENCOUNTER — Encounter (HOSPITAL_BASED_OUTPATIENT_CLINIC_OR_DEPARTMENT_OTHER): Payer: Self-pay | Admitting: Anesthesiology

## 2011-09-03 ENCOUNTER — Encounter (HOSPITAL_BASED_OUTPATIENT_CLINIC_OR_DEPARTMENT_OTHER): Payer: Self-pay | Admitting: *Deleted

## 2011-09-03 ENCOUNTER — Encounter (HOSPITAL_BASED_OUTPATIENT_CLINIC_OR_DEPARTMENT_OTHER)
Admission: RE | Disposition: A | Discharge status: Home / Self Care | Payer: Self-pay | Source: Ambulatory Visit | Attending: Orthopedic Surgery

## 2011-09-03 ENCOUNTER — Ambulatory Visit (HOSPITAL_BASED_OUTPATIENT_CLINIC_OR_DEPARTMENT_OTHER)
Admission: RE | Admit: 2011-09-03 | Discharge: 2011-09-03 | Disposition: A | Discharge status: Home / Self Care | Payer: BC Managed Care – PPO | Source: Ambulatory Visit | Attending: Orthopedic Surgery | Admitting: Orthopedic Surgery

## 2011-09-03 ENCOUNTER — Ambulatory Visit (HOSPITAL_BASED_OUTPATIENT_CLINIC_OR_DEPARTMENT_OTHER): Payer: BC Managed Care – PPO | Admitting: Anesthesiology

## 2011-09-03 DIAGNOSIS — K219 Gastro-esophageal reflux disease without esophagitis: Secondary | ICD-10-CM | POA: Insufficient documentation

## 2011-09-03 DIAGNOSIS — J45909 Unspecified asthma, uncomplicated: Secondary | ICD-10-CM | POA: Insufficient documentation

## 2011-09-03 DIAGNOSIS — R51 Headache: Secondary | ICD-10-CM | POA: Insufficient documentation

## 2011-09-03 DIAGNOSIS — G5602 Carpal tunnel syndrome, left upper limb: Secondary | ICD-10-CM

## 2011-09-03 DIAGNOSIS — M5030 Other cervical disc degeneration, unspecified cervical region: Secondary | ICD-10-CM | POA: Insufficient documentation

## 2011-09-03 DIAGNOSIS — G5600 Carpal tunnel syndrome, unspecified upper limb: Secondary | ICD-10-CM | POA: Insufficient documentation

## 2011-09-03 HISTORY — DX: Other cervical disc degeneration, unspecified cervical region: M50.30

## 2011-09-03 HISTORY — PX: CARPAL TUNNEL RELEASE: SHX101

## 2011-09-03 HISTORY — DX: Sciatica, unspecified side: M54.30

## 2011-09-03 HISTORY — DX: Nausea with vomiting, unspecified: R11.2

## 2011-09-03 HISTORY — DX: Other specified postprocedural states: Z98.890

## 2011-09-03 HISTORY — DX: Carpal tunnel syndrome, left upper limb: G56.02

## 2011-09-03 HISTORY — DX: Headache: R51

## 2011-09-03 HISTORY — DX: Adverse effect of unspecified anesthetic, initial encounter: T41.45XA

## 2011-09-03 SURGERY — CARPAL TUNNEL RELEASE
Anesthesia: General | Site: Wrist | Laterality: Left | Wound class: Clean

## 2011-09-03 MED ORDER — PROMETHAZINE HCL 25 MG PO TABS: 25.0000 mg | ORAL_TABLET | Freq: Four times a day (QID) | ORAL | Status: DC | PRN

## 2011-09-03 MED ORDER — OXYCODONE-ACETAMINOPHEN 5-325 MG PO TABS: 1.0000 | ORAL_TABLET | Freq: Four times a day (QID) | ORAL | Status: AC | PRN

## 2011-09-03 MED ORDER — FENTANYL CITRATE 0.05 MG/ML IJ SOLN
INTRAMUSCULAR | Status: DC | PRN
  Administered 2011-09-03: 50 ug via INTRAVENOUS

## 2011-09-03 MED ORDER — LIDOCAINE HCL (CARDIAC) 20 MG/ML IV SOLN
INTRAVENOUS | Status: DC | PRN
  Administered 2011-09-03: 30 mg via INTRAVENOUS

## 2011-09-03 MED ORDER — BUPIVACAINE HCL (PF) 0.5 % IJ SOLN
INTRAMUSCULAR | Status: DC | PRN
  Administered 2011-09-03: 10 mL

## 2011-09-03 MED ORDER — PROPOFOL 10 MG/ML IV EMUL
INTRAVENOUS | Status: DC | PRN
  Administered 2011-09-03: 50 mg via INTRAVENOUS
  Administered 2011-09-03: 150 mg via INTRAVENOUS

## 2011-09-03 MED ORDER — MIDAZOLAM HCL 5 MG/5ML IJ SOLN
INTRAMUSCULAR | Status: DC | PRN
  Administered 2011-09-03: 1 mg via INTRAVENOUS

## 2011-09-03 MED ORDER — ACETAMINOPHEN 10 MG/ML IV SOLN
1000.0000 mg | Freq: Once | INTRAVENOUS | Status: AC
  Administered 2011-09-03: 1000 mg via INTRAVENOUS

## 2011-09-03 MED ORDER — ONDANSETRON HCL 4 MG/2ML IJ SOLN
INTRAMUSCULAR | Status: DC | PRN
  Administered 2011-09-03: 4 mg via INTRAVENOUS

## 2011-09-03 MED ORDER — LACTATED RINGERS IV SOLN
INTRAVENOUS | Status: DC
  Administered 2011-09-03 (×3): via INTRAVENOUS

## 2011-09-03 MED ORDER — FENTANYL CITRATE 0.05 MG/ML IJ SOLN
25.0000 ug | INTRAMUSCULAR | Status: DC | PRN
  Administered 2011-09-03: 25 ug via INTRAVENOUS
  Administered 2011-09-03: 50 ug via INTRAVENOUS

## 2011-09-03 MED ORDER — CEFAZOLIN SODIUM 1-5 GM-% IV SOLN: 1.0000 g | INTRAVENOUS | Status: DC

## 2011-09-03 MED ORDER — DEXAMETHASONE SODIUM PHOSPHATE 4 MG/ML IJ SOLN
INTRAMUSCULAR | Status: DC | PRN
  Administered 2011-09-03: 10 mg via INTRAVENOUS

## 2011-09-03 SURGICAL SUPPLY — 41 items
BANDAGE ELASTIC 3 VELCRO ST LF (GAUZE/BANDAGES/DRESSINGS) ×2 IMPLANT
BLADE SURG 15 STRL LF DISP TIS (BLADE) ×1 IMPLANT
BLADE SURG 15 STRL SS (BLADE) ×2
BNDG CMPR 9X4 STRL LF SNTH (GAUZE/BANDAGES/DRESSINGS) ×1
BNDG ESMARK 4X9 LF (GAUZE/BANDAGES/DRESSINGS) ×1 IMPLANT
CLOTH BEACON ORANGE TIMEOUT ST (SAFETY) ×1 IMPLANT
CORDS BIPOLAR (ELECTRODE) ×2 IMPLANT
COVER TABLE BACK 60X90 (DRAPES) ×2 IMPLANT
CUFF TOURNIQUET SINGLE 18IN (TOURNIQUET CUFF) ×1 IMPLANT
DRAPE EXTREMITY T 121X128X90 (DRAPE) ×2 IMPLANT
DRAPE SURG 17X23 STRL (DRAPES) ×2 IMPLANT
DRAPE U 20/CS (DRAPES) ×2 IMPLANT
DURAPREP 26ML APPLICATOR (WOUND CARE) ×2 IMPLANT
GAUZE XEROFORM 1X8 LF (GAUZE/BANDAGES/DRESSINGS) ×2 IMPLANT
GLOVE BIO SURGEON STRL SZ 6.5 (GLOVE) ×1 IMPLANT
GLOVE BIO SURGEON STRL SZ8 (GLOVE) ×2 IMPLANT
GLOVE BIOGEL PI IND STRL 8 (GLOVE) ×2 IMPLANT
GLOVE BIOGEL PI INDICATOR 8 (GLOVE) ×2
GLOVE EXAM NITRILE PF MED BLUE (GLOVE) ×1 IMPLANT
GLOVE ORTHO TXT STRL SZ7.5 (GLOVE) ×2 IMPLANT
GOWN PREVENTION PLUS XLARGE (GOWN DISPOSABLE) ×3 IMPLANT
NDL HYPO 25X1 1.5 SAFETY (NEEDLE) IMPLANT
NEEDLE HYPO 25X1 1.5 SAFETY (NEEDLE) ×2 IMPLANT
NS IRRIG 1000ML POUR BTL (IV SOLUTION) ×2 IMPLANT
PACK BASIN DAY SURGERY FS (CUSTOM PROCEDURE TRAY) ×2 IMPLANT
PAD CAST 3X4 CTTN HI CHSV (CAST SUPPLIES) ×1 IMPLANT
PADDING CAST ABS 3INX4YD NS (CAST SUPPLIES) ×1
PADDING CAST ABS 4INX4YD NS (CAST SUPPLIES) ×1
PADDING CAST ABS COTTON 3X4 (CAST SUPPLIES) ×1 IMPLANT
PADDING CAST ABS COTTON 4X4 ST (CAST SUPPLIES) ×1 IMPLANT
PADDING CAST COTTON 3X4 STRL (CAST SUPPLIES) ×2
SPLINT PLASTER CAST XFAST 3X15 (CAST SUPPLIES) IMPLANT
SPLINT PLASTER XTRA FASTSET 3X (CAST SUPPLIES) ×10
SPONGE GAUZE 4X4 12PLY (GAUZE/BANDAGES/DRESSINGS) ×2 IMPLANT
STOCKINETTE 4X48 STRL (DRAPES) ×2 IMPLANT
SUT ETHILON 4 0 PS 2 18 (SUTURE) ×2 IMPLANT
SYR BULB 3OZ (MISCELLANEOUS) ×2 IMPLANT
SYR CONTROL 10ML LL (SYRINGE) ×1 IMPLANT
TOWEL OR 17X24 6PK STRL BLUE (TOWEL DISPOSABLE) ×2 IMPLANT
UNDERPAD 30X30 INCONTINENT (UNDERPADS AND DIAPERS) ×2 IMPLANT
WATER STERILE IRR 1000ML POUR (IV SOLUTION) ×1 IMPLANT

## 2011-09-03 NOTE — H&P (Signed)
  PREOPERATIVE H&P  Chief Complaint: left carpal tunnel syndrome  HPI: Ariel Cook is a 66 y.o. female who presents for preoperative history and physical with a diagnosis of left carpal tunnel syndrome. Symptoms are rated as moderate to severe, and have been worsening.  This is significantly impairing activities of daily living.  She has elected for surgical management.   Past Medical History  Diagnosis Date  . Unspecified adverse effect of anesthesia     CAUSES ASTHMA TO EXACERBATE, CAUSES MIGRAINES  . PONV (postoperative nausea and vomiting)   . Asthma     USES ALBUTEROL INHALER  . Headache   . Sciatica   . DDD (degenerative disc disease), cervical    Past Surgical History  Procedure Date  . Tubal ligation 1976  . Eye surgery 2011    BIL CATARACT  . Appendectomy 2010  . Carpal tunnel release 2010    RT   History   Social History  . Marital Status: Married    Spouse Name: N/A    Number of Children: N/A  . Years of Education: N/A   Social History Main Topics  . Smoking status: Never Smoker   . Smokeless tobacco: None  . Alcohol Use: No  . Drug Use: No  . Sexually Active: No   Other Topics Concern  . None   Social History Narrative  . None   History reviewed. No pertinent family history. Allergies  Allergen Reactions  . Sulfa Antibiotics Anaphylaxis   Prior to Admission medications   Medication Sig Start Date End Date Taking? Authorizing Provider  acetaminophen (TYLENOL) 500 MG tablet Take 1,000 mg by mouth every 4 (four) hours as needed.   Yes Historical Provider, MD  albuterol (PROVENTIL HFA;VENTOLIN HFA) 108 (90 BASE) MCG/ACT inhaler Inhale 2 puffs into the lungs every 6 (six) hours as needed.   Yes Historical Provider, MD  ibuprofen (ADVIL,MOTRIN) 200 MG tablet Take 600 mg by mouth every 6 (six) hours as needed.   Yes Historical Provider, MD  traMADol (ULTRAM) 50 MG tablet Take 100 mg by mouth every 6 (six) hours as needed.   Yes Historical Provider,  MD     Positive ROS: All other systems have been reviewed and were otherwise negative with the exception of those mentioned in the HPI and as above.  Physical Exam: General: Alert, no acute distress Cardiovascular: No pedal edema Respiratory: No cyanosis, no use of accessory musculature GI: No organomegaly, abdomen is soft and non-tender Skin: No lesions in the area of chief complaint Neurologic: Sensation intact distally Psychiatric: Patient is competent for consent with normal mood and affect Lymphatic: No axillary or cervical lymphadenopathy  MUSCULOSKELETAL: Positive Phalen's test, normal capillary refill throughout the hand.  Assessment: left carpal tunnel syndrome  Plan: Plan for Procedure(s): CARPAL TUNNEL RELEASE  The risks benefits and alternatives were discussed with the patient including but not limited to the risks of nonoperative treatment, versus surgical intervention including infection, bleeding, nerve injury,  blood clots, cardiopulmonary complications, morbidity, mortality, among others, and they were willing to proceed. We also discussed the risks of incomplete relief of symptoms, progression of neuropathy, among others.  Eulas Post, MD 09/03/2011 7:22 AM

## 2011-09-03 NOTE — Anesthesia Postprocedure Evaluation (Signed)
  Anesthesia Post-op Note  Patient: Ariel Cook  Procedure(s) Performed: Procedure(s) (LRB): CARPAL TUNNEL RELEASE (Left)  Patient Location: PACU  Anesthesia Type: General  Level of Consciousness: awake, alert  and oriented  Airway and Oxygen Therapy: Patient Spontanous Breathing  Post-op Pain: mild  Post-op Assessment: Post-op Vital signs reviewed, Patient's Cardiovascular Status Stable, Respiratory Function Stable, Patent Airway, No signs of Nausea or vomiting and Pain level controlled  Post-op Vital Signs: Reviewed and stable  Complications: No apparent anesthesia complications

## 2011-09-03 NOTE — Op Note (Signed)
09/03/2011  9:17 AM  PATIENT:  Ariel Cook    PRE-OPERATIVE DIAGNOSIS:  left carpal tunnel syndrome  POST-OPERATIVE DIAGNOSIS:  Same  PROCEDURE:  CARPAL TUNNEL RELEASE  SURGEON:  Eulas Post, MD  PHYSICIAN ASSISTANT: Janace Litten, OPA-C, present and scrubbed throughout the case, critical for completion in a timely fashion, and for retraction, instrumentation, and closure.  ANESTHESIA:   General  PREOPERATIVE INDICATIONS:  [name] is a  66 y.o. female with a diagnosis of left carpal tunnel syndrome who failed conservative measures and elected for surgical management.    The risks benefits and alternatives were discussed with the patient preoperatively including but not limited to the risks of infection, bleeding, nerve injury, cardiopulmonary complications, the need for revision surgery, among others, and the patient was willing to proceed. We'll discuss the risks of incomplete relief of symptoms, recurrent carpal tunnel syndrome, among others.  OPERATIVE FINDINGS: Minimal atrophy of the median nerve, with minimal injection.  OPERATIVE PROCEDURE: The patient is brought to the operating room placed in the supine position. General anesthesia was administered. The left upper extremity was prepped and draped in usual sterile fashion. Antibiotics were given. Time performed. The arm was elevated and exsanguinated and the tourniquet was inflated. Incision was made in line with the radial border of the ring finger. The carpal tunnel transverse fascia was identified, cleaned, and incised sharply. The median nerve was protected below. Deep retractors were placed underneath the transverse carpal ligament, protecting the nerve. I released the ligament completely, and then released the proximal distal volar forearm fascia. The nerve was identified, and visualized, and protected throughout the case. The wounds were irrigated copiously, and the wounds injected with Monocryl, and the skin  closed with nylon followed by a volar splint and sterile gauze. She tolerated this well, with no complications.

## 2011-09-03 NOTE — Anesthesia Procedure Notes (Signed)
Procedure Name: LMA Insertion Date/Time: 09/03/2011 8:48 AM Performed by: Gar Gibbon Pre-anesthesia Checklist: Patient identified, Emergency Drugs available, Suction available and Patient being monitored Patient Re-evaluated:Patient Re-evaluated prior to inductionOxygen Delivery Method: Circle System Utilized Preoxygenation: Pre-oxygenation with 100% oxygen Intubation Type: IV induction Ventilation: Mask ventilation without difficulty LMA: LMA inserted LMA Size: 4.0 Number of attempts: 1 Airway Equipment and Method: bite block Placement Confirmation: positive ETCO2 Tube secured with: Tape Dental Injury: Teeth and Oropharynx as per pre-operative assessment

## 2011-09-03 NOTE — Discharge Instructions (Signed)
Carpal Tunnel Release Carpal tunnel release is done to relieve the pressure on the nerves and tendons on the bottom side of your wrist.  LET YOUR CAREGIVER KNOW ABOUT:   Allergies to food or medicine.   Medicines taken, including vitamins, herbs, eyedrops, over-the-counter medicines, and creams.   Use of steroids (by mouth or creams).   Previous problems with anesthetics or numbing medicines.   History of bleeding problems or blood clots.   Previous surgery.   Other health problems, including diabetes and kidney problems.   Possibility of pregnancy, if this applies.  RISKS AND COMPLICATIONS  Some problems that may happen after this procedure include:  Infection.   Damage to the nerves, arteries or tendons could occur. This would be very uncommon.   Bleeding.  BEFORE THE PROCEDURE   This surgery may be done while you are asleep (general anesthetic) or may be done under a block where only your forearm and the surgical area is numb.   If the surgery is done under a block, the numbness will gradually wear off within several hours after surgery.  HOME CARE INSTRUCTIONS   Have a responsible person with you for 24 hours.   Do not drive a car or use public transportation for 24 hours.   Only take over-the-counter or prescription medicines for pain, discomfort, or fever as directed by your caregiver. Take them as directed.   You may put ice on the palm side of the affected wrist.   Put ice in a plastic bag.   Place a towel between your skin and the bag.   Leave the ice on for 20 to 30 minutes, 4 times per day.   If you were given a splint to keep your wrist from bending, use it as directed. It is important to wear the splint at night or as directed. Use the splint for as long as you have pain or numbness in your hand, arm, or wrist. This may take 1 to 2 months.   Keep your hand raised (elevated) above the level of your heart as much as possible. This keeps swelling down and  helps with discomfort.   Change bandages (dressings) as directed.   Keep the wound clean and dry.  SEEK MEDICAL CARE IF:   You develop pain not relieved with medications.   You develop numbness of your hand.   You develop bleeding from your surgical site.   You have an oral temperature above 102 F (38.9 C).   You develop redness or swelling of the surgical site.   You develop new, unexplained problems.  SEEK IMMEDIATE MEDICAL CARE IF:   You develop a rash.   You have difficulty breathing.   You develop any reaction or side effects to medications given.  Document Released: 07/10/2003 Document Revised: 04/08/2011 Document Reviewed: 02/23/2007 [facility] Patient Information 2012 [place], [place].     Post Anesthesia Home Care Instructions  Activity: Get plenty of rest for the remainder of the day. A responsible adult should stay with you for 24 hours following the procedure.  For the next 24 hours, DO NOT: -Drive a car -Advertising copywriter -Drink alcoholic beverages -Take any medication unless instructed by your physician -Make any legal decisions or sign important papers.  Meals: Start with liquid foods such as gelatin or soup. Progress to regular foods as tolerated. Avoid greasy, spicy, heavy foods. If nausea and/or vomiting occur, drink only clear liquids until the nausea and/or vomiting subsides. Call your physician if vomiting continues.  Special Instructions/Symptoms: Your throat may feel dry or sore from the anesthesia or the breathing tube placed in your throat during surgery. If this causes discomfort, gargle with warm salt water. The discomfort should disappear within 24 hours.   Call your surgeon if you experience:   1.  Fever over 101.0. 2.  Inability to urinate. 3.  Nausea and/or vomiting. 4.  Extreme swelling or bruising at the surgical site. 5.  Continued bleeding from the incision. 6.  Increased pain, redness or drainage from the incision. 7.   Problems related to your pain medication.

## 2011-09-03 NOTE — Transfer of Care (Signed)
Immediate Anesthesia Transfer of Care Note  Patient: Ariel Cook  Procedure(s) Performed: Procedure(s) (LRB): CARPAL TUNNEL RELEASE (Left)  Patient Location: PACU  Anesthesia Type: General  Level of Consciousness: sedated  Airway & Oxygen Therapy: Patient Spontanous Breathing and Patient connected to face mask oxygen  Post-op Assessment: Report given to PACU RN and Post -op Vital signs reviewed and stable  Post vital signs: Reviewed and stable  Complications: No apparent anesthesia complications

## 2011-09-03 NOTE — Anesthesia Preprocedure Evaluation (Addendum)
Anesthesia Evaluation  Patient identified by MRN, date of birth, ID band Patient awake    Reviewed: Allergy & Precautions, H&P , NPO status , Patient's Chart, lab work & pertinent test results  History of Anesthesia Complications Negative for: history of anesthetic complications  Airway Mallampati: II TM Distance: >3 FB Neck ROM: Full    Dental No notable dental hx. (+) Teeth Intact and Dental Advisory Given   Pulmonary asthma (well controlled, rarely uses rescue inhaler) ,  breath sounds clear to auscultation  Pulmonary exam normal       Cardiovascular negative cardio ROS  Rhythm:Regular Rate:Normal  '06 stress test: no ischemia, EF 76%   Neuro/Psych  Headaches, PSYCHIATRIC DISORDERS Anxiety    GI/Hepatic Neg liver ROS, GERD-  Controlled,  Endo/Other  negative endocrine ROS  Renal/GU negative Renal ROS     Musculoskeletal   Abdominal   Peds  Hematology negative hematology ROS (+)   Anesthesia Other Findings   Reproductive/Obstetrics                          Anesthesia Physical Anesthesia Plan  ASA: II  Anesthesia Plan: General   Post-op Pain Management:    Induction: Intravenous  Airway Management Planned: LMA  Additional Equipment:   Intra-op Plan:   Post-operative Plan:   Informed Consent: I have reviewed the patients History and Physical, chart, labs and discussed the procedure including the risks, benefits and alternatives for the proposed anesthesia with the patient or authorized representative who has indicated his/her understanding and acceptance.   Dental advisory given  Plan Discussed with: CRNA and Surgeon  Anesthesia Plan Comments: (Plan routine monitors, GA- LMA, patient understands and accepts perioperative narcotic)        Anesthesia Quick Evaluation

## 2011-09-06 ENCOUNTER — Encounter (HOSPITAL_BASED_OUTPATIENT_CLINIC_OR_DEPARTMENT_OTHER): Payer: Self-pay | Admitting: Orthopedic Surgery

## 2012-04-03 ENCOUNTER — Other Ambulatory Visit: Payer: Self-pay | Admitting: Family Medicine

## 2012-04-03 DIAGNOSIS — Z09 Encounter for follow-up examination after completed treatment for conditions other than malignant neoplasm: Secondary | ICD-10-CM

## 2012-04-07 ENCOUNTER — Ambulatory Visit (HOSPITAL_COMMUNITY)
Admission: RE | Admit: 2012-04-07 | Discharge: 2012-04-07 | Disposition: A | Discharge status: Home / Self Care | Payer: Medicare Other | Source: Ambulatory Visit | Attending: Family Medicine | Admitting: Family Medicine

## 2012-04-07 DIAGNOSIS — Z1231 Encounter for screening mammogram for malignant neoplasm of breast: Secondary | ICD-10-CM | POA: Insufficient documentation

## 2012-04-07 DIAGNOSIS — Z09 Encounter for follow-up examination after completed treatment for conditions other than malignant neoplasm: Secondary | ICD-10-CM

## 2012-09-19 ENCOUNTER — Encounter: Payer: Self-pay | Admitting: Family Medicine

## 2012-09-19 ENCOUNTER — Ambulatory Visit (INDEPENDENT_AMBULATORY_CARE_PROVIDER_SITE_OTHER): Payer: Medicare PPO | Admitting: Family Medicine

## 2012-09-19 VITALS — BP 110/70 | Temp 98.5°F | Wt 156.0 lb

## 2012-09-19 DIAGNOSIS — J45901 Unspecified asthma with (acute) exacerbation: Secondary | ICD-10-CM

## 2012-09-19 MED ORDER — PREDNISONE 20 MG PO TABS: ORAL_TABLET | ORAL | Status: AC

## 2012-09-19 MED ORDER — METHYLPREDNISOLONE ACETATE 40 MG/ML IJ SUSP
40.0000 mg | Freq: Once | INTRAMUSCULAR | Status: AC
  Administered 2012-09-19: 40 mg via INTRAMUSCULAR

## 2012-09-19 NOTE — Progress Notes (Signed)
  Subjective:    Patient ID: Ariel Cook, female    DOB: 1945-10-02, 67 y.o.   MRN: [id number]  Breathing Problem She complains of cough and difficulty breathing. The cough is non-productive. Associated symptoms include chest pain. Her past medical history is significant for asthma.  moderate seasonal allergies Yesterday with sharp pain with deep breath No fever No inhaler use/ not around smoke At times with chest  widowed   Review of Systems  Respiratory: Positive for cough.   Cardiovascular: Positive for chest pain.  no hemoptysis Fam Hx- lung issues     Objective:   Physical Exam VSS Has a tight cough no crackles heard not respiratory distress no wheeze heart regular pulse normal blood pressure good sinus nontender ears normal throat normal      Assessment & Plan:  Allergies along with asthma flareup-prednisone taper along with the Depo-Medrol shot also use Xopenex inhaler as directed if progressive troubles followup over-the-counter allergy tablet daily for the next few weeks warning signs were discussed no sign of any infection no antibiotics indicated.

## 2012-09-19 NOTE — Patient Instructions (Signed)
Store brand Allegra 180 or Claritin 10 mg one daily for next few weeks  Use inhaler 2 puff Xopenex every 6 hours  If worse follow up

## 2012-12-21 ENCOUNTER — Ambulatory Visit (INDEPENDENT_AMBULATORY_CARE_PROVIDER_SITE_OTHER): Payer: Medicare PPO | Admitting: Family Medicine

## 2012-12-21 ENCOUNTER — Encounter: Payer: Self-pay | Admitting: Family Medicine

## 2012-12-21 VITALS — BP 132/78 | Ht 61.0 in | Wt 158.0 lb

## 2012-12-21 DIAGNOSIS — M542 Cervicalgia: Secondary | ICD-10-CM

## 2012-12-21 MED ORDER — PREDNISONE 20 MG PO TABS: ORAL_TABLET | ORAL | Status: DC

## 2012-12-21 MED ORDER — TIZANIDINE HCL 4 MG PO TABS: 4.0000 mg | ORAL_TABLET | Freq: Three times a day (TID) | ORAL | Status: DC

## 2012-12-21 NOTE — Progress Notes (Signed)
  Subjective:    Patient ID: Ariel Cook, female    DOB: Sep 18, 1945, 67 y.o.   MRN: [id number]  HPI  Patient arrives with complaint of left sided neck pain that shoots down her left arm. Sister passed away yest.  Pain sharp at times , worse with motion  Had to do some lifting of a heavy patient  Ibuprofen--not helping very much  Review of Systems No nausea no chest pain no abdominal pain    Objective:   Physical Exam Alert good hydration. Lungs clear. Heart regular in rhythm. Shoulder good range of motion. Lateral neck tender to palpation       Assessment & Plan:  Impression supraclavicular strain with secondary neuropathic pain. Plan prednisone taper due to inability to take anti-inflammatories. Zanaflex when necessary for spasm. Symptomatic care discussed. WSL

## 2012-12-29 ENCOUNTER — Other Ambulatory Visit: Payer: Self-pay | Admitting: Family Medicine

## 2013-01-25 ENCOUNTER — Telehealth: Payer: Self-pay | Admitting: Family Medicine

## 2013-01-25 DIAGNOSIS — Z79899 Other long term (current) drug therapy: Secondary | ICD-10-CM

## 2013-01-25 DIAGNOSIS — E782 Mixed hyperlipidemia: Secondary | ICD-10-CM

## 2013-01-25 NOTE — Telephone Encounter (Signed)
Patient needs BW paperwork °

## 2013-01-29 NOTE — Telephone Encounter (Signed)
Blood work ordered in Epic. Patient notified. 

## 2013-01-29 NOTE — Telephone Encounter (Signed)
Lip liv m7 

## 2013-02-05 LAB — HEPATIC FUNCTION PANEL
ALT: 15 U/L (ref 0–35)
Alkaline Phosphatase: 89 U/L (ref 39–117)
Indirect Bilirubin: 0.4 mg/dL (ref 0.0–0.9)
Total Protein: 6.3 g/dL (ref 6.0–8.3)

## 2013-02-05 LAB — BASIC METABOLIC PANEL
BUN: 7 mg/dL (ref 6–23)
Chloride: 104 mEq/L (ref 96–112)
Creat: 0.78 mg/dL (ref 0.50–1.10)

## 2013-02-05 LAB — LIPID PANEL
Cholesterol: 222 mg/dL — ABNORMAL HIGH (ref 0–200)
Triglycerides: 59 mg/dL (ref <150)

## 2013-02-06 ENCOUNTER — Encounter: Payer: Self-pay | Admitting: Family Medicine

## 2013-03-14 ENCOUNTER — Other Ambulatory Visit: Payer: Self-pay | Admitting: Family Medicine

## 2013-03-14 DIAGNOSIS — Z139 Encounter for screening, unspecified: Secondary | ICD-10-CM

## 2013-03-17 ENCOUNTER — Encounter: Payer: Self-pay | Admitting: *Deleted

## 2013-03-22 ENCOUNTER — Encounter: Payer: Self-pay | Admitting: Family Medicine

## 2013-03-22 ENCOUNTER — Ambulatory Visit (INDEPENDENT_AMBULATORY_CARE_PROVIDER_SITE_OTHER): Payer: Medicare PPO | Admitting: Family Medicine

## 2013-03-22 ENCOUNTER — Ambulatory Visit (HOSPITAL_COMMUNITY)
Admission: RE | Admit: 2013-03-22 | Discharge: 2013-03-22 | Disposition: A | Discharge status: Home / Self Care | Payer: Medicare PPO | Source: Ambulatory Visit | Attending: Family Medicine | Admitting: Family Medicine

## 2013-03-22 VITALS — BP 122/82 | Ht 61.0 in | Wt 161.8 lb

## 2013-03-22 DIAGNOSIS — R059 Cough, unspecified: Secondary | ICD-10-CM | POA: Insufficient documentation

## 2013-03-22 DIAGNOSIS — R05 Cough: Secondary | ICD-10-CM | POA: Insufficient documentation

## 2013-03-22 DIAGNOSIS — I872 Venous insufficiency (chronic) (peripheral): Secondary | ICD-10-CM

## 2013-03-22 DIAGNOSIS — R0602 Shortness of breath: Secondary | ICD-10-CM | POA: Insufficient documentation

## 2013-03-22 DIAGNOSIS — R079 Chest pain, unspecified: Secondary | ICD-10-CM

## 2013-03-22 DIAGNOSIS — Z23 Encounter for immunization: Secondary | ICD-10-CM

## 2013-03-22 DIAGNOSIS — I878 Other specified disorders of veins: Secondary | ICD-10-CM

## 2013-03-22 DIAGNOSIS — J45909 Unspecified asthma, uncomplicated: Secondary | ICD-10-CM

## 2013-03-22 MED ORDER — FLUTICASONE PROPIONATE HFA 110 MCG/ACT IN AERO: 2.0000 | INHALATION_SPRAY | Freq: Two times a day (BID) | RESPIRATORY_TRACT | Status: DC

## 2013-03-22 NOTE — Progress Notes (Signed)
  Subjective:    Patient ID: Myrtie Hawk, female    DOB: [birth year], 67 y.o.   MRN: [id number]  HPI  Patient arrives for annual physical. Mini cog -pass Get up and go pass. Having problems with asthma and   circulation problems in feet and legs.legs swell up considerably at times. Toes turn dark at times.Hx of old injury to ankle Swelling worsens at night. Results for orders placed in visit on 01/25/13  LIPID PANEL      Result Value Range   Cholesterol 222 (*) 0 - 200 mg/dL   Triglycerides 59  <409 mg/dL   HDL 75  >81 mg/dL   Total CHOL/HDL Ratio 3.0     VLDL 12  0 - 40 mg/dL   LDL Cholesterol 191 (*) 0 - 99 mg/dL  HEPATIC FUNCTION PANEL      Result Value Range   Total Bilirubin 0.5  0.3 - 1.2 mg/dL   Bilirubin, Direct 0.1  0.0 - 0.3 mg/dL   Indirect Bilirubin 0.4  0.0 - 0.9 mg/dL   Alkaline Phosphatase 89  39 - 117 U/L   AST 18  0 - 37 U/L   ALT 15  0 - 35 U/L   Total Protein 6.3  6.0 - 8.3 g/dL   Albumin 4.1  3.5 - 5.2 g/dL  BASIC METABOLIC PANEL      Result Value Range   Sodium 138  135 - 145 mEq/L   Potassium 4.2  3.5 - 5.3 mEq/L   Chloride 104  96 - 112 mEq/L   CO2 29  19 - 32 mEq/L   Glucose, Bld 91  70 - 99 mg/dL   BUN 7  6 - 23 mg/dL   Creat 4.78  2.95 - 6.21 mg/dL   Calcium 9.5  8.4 - 30.8 mg/dL    Pt notes chest discomfort. Sharp in nature times. Achiness at times. Strong family history of coronary artery disease. Nonexertional. Worse with a deep breath. Recent cough nonproductive.  Increased wheezing with asthma lately. Nearly daily symptomatology. A lot of stress this year. Review of Systems No headache no back pain no abdominal pain no change in urinary or bowel habits ROS otherwise negative    Objective:   Physical Exam Alert HEENT normal neck supple. Lungs bilateral wheezes. Heart regular rate and rhythm chest wall some tenderness to palpation. Abdomen benign. Extremities feet excellent pulses, sensation intact, 1+ edema bilateral  EKG sinus  bradycardia no significant ST-T changes       Assessment & Plan:  Impression #1 chest pain likely musculoskeletal. Highly doubt cardiac etiology rationale discussed. A lot of more coughing recently may have set patient up for this. #2 flare of asthma and also chronic persistent asthma and noted. Discussed at length appropriate intervention. #3 circulation concerns arterial flow perfect some element of venous stasis. Plan initiate Flovent 110 2 mics twice a day. Ibuprofen when necessary for pain. Change Dyazide to each morning. Ventolin when necessary for wheezes. Also recommend chest x-ray. Followup for preventive exam as scheduled.

## 2013-03-26 DIAGNOSIS — R079 Chest pain, unspecified: Secondary | ICD-10-CM | POA: Insufficient documentation

## 2013-03-26 DIAGNOSIS — J45909 Unspecified asthma, uncomplicated: Secondary | ICD-10-CM | POA: Insufficient documentation

## 2013-03-26 DIAGNOSIS — I878 Other specified disorders of veins: Secondary | ICD-10-CM | POA: Insufficient documentation

## 2013-04-09 ENCOUNTER — Ambulatory Visit (INDEPENDENT_AMBULATORY_CARE_PROVIDER_SITE_OTHER): Payer: Medicare PPO | Admitting: Family Medicine

## 2013-04-09 ENCOUNTER — Ambulatory Visit (HOSPITAL_COMMUNITY)
Admission: RE | Admit: 2013-04-09 | Discharge: 2013-04-09 | Disposition: A | Discharge status: Home / Self Care | Payer: Medicare PPO | Source: Ambulatory Visit | Attending: Family Medicine | Admitting: Family Medicine

## 2013-04-09 ENCOUNTER — Encounter: Payer: Self-pay | Admitting: Family Medicine

## 2013-04-09 VITALS — BP 128/86 | Ht 61.0 in | Wt 158.0 lb

## 2013-04-09 DIAGNOSIS — Z0000 Encounter for general adult medical examination without abnormal findings: Secondary | ICD-10-CM

## 2013-04-09 DIAGNOSIS — Z1231 Encounter for screening mammogram for malignant neoplasm of breast: Secondary | ICD-10-CM | POA: Insufficient documentation

## 2013-04-09 DIAGNOSIS — J45909 Unspecified asthma, uncomplicated: Secondary | ICD-10-CM

## 2013-04-09 DIAGNOSIS — Z124 Encounter for screening for malignant neoplasm of cervix: Secondary | ICD-10-CM

## 2013-04-09 DIAGNOSIS — Z139 Encounter for screening, unspecified: Secondary | ICD-10-CM

## 2013-04-09 MED ORDER — TRIAMTERENE-HCTZ 37.5-25 MG PO CAPS: ORAL_CAPSULE | ORAL | Status: DC

## 2013-04-09 MED ORDER — FLUTICASONE PROPIONATE HFA 110 MCG/ACT IN AERO: 2.0000 | INHALATION_SPRAY | Freq: Two times a day (BID) | RESPIRATORY_TRACT | Status: DC

## 2013-04-09 NOTE — Progress Notes (Signed)
   Subjective:    Patient ID: Ariel Cook, female    DOB: 17-Jul-1945, 67 y.o.   MRN: [id number]  HPI  Patient arrives for an annual physical. Mini cog -passed  . Get up and go test passed.   Patient stated that she wants to stop Flovent she doesn't like it and doesn't think it helps.  Exercising moderately regular.  Try need to write things with her diet.  A lot of stress lately though feeling better in that regard now.   Review of Systems  Constitutional: Negative for activity change, appetite change and fatigue.  HENT: Negative for congestion, ear discharge and rhinorrhea.   Eyes: Negative for discharge.  Respiratory: Negative for cough, chest tightness and wheezing.   Cardiovascular: Negative for chest pain.  Gastrointestinal: Negative for vomiting and abdominal pain.  Genitourinary: Negative for frequency and difficulty urinating.  Musculoskeletal: Negative for neck pain.  Allergic/Immunologic: Negative for environmental allergies and food allergies.  Neurological: Negative for weakness and headaches.  Psychiatric/Behavioral: Negative for behavioral problems and agitation.       Objective:   Physical Exam  Vitals reviewed. Constitutional: She is oriented to person, place, and time. She appears well-developed and well-nourished.  HENT:  Head: Normocephalic.  Right Ear: External ear normal.  Left Ear: External ear normal.  Eyes: Pupils are equal, round, and reactive to light.  Neck: Normal range of motion. No thyromegaly present.  Cardiovascular: Normal rate, regular rhythm, normal heart sounds and intact distal pulses.   No murmur heard. Pulmonary/Chest: Effort normal and breath sounds normal. No respiratory distress. She has no wheezes.  Abdominal: Soft. Bowel sounds are normal. She exhibits no distension and no mass. There is no tenderness.  Musculoskeletal: Normal range of motion. She exhibits no edema and no tenderness.  Lymphadenopathy:    She has no  cervical adenopathy.  Neurological: She is alert and oriented to person, place, and time. She exhibits normal muscle tone.  Skin: Skin is warm and dry.  Psychiatric: She has a normal mood and affect. Her behavior is normal.          Assessment & Plan:  Impression 1 wellness exam up-to-date on mammogram and colonoscopy. #2 asthma decent control #3 vaccinations discussed. Plan maintain all meds. Diet exercise discussed in encourage. Hemoccult cards. Pap smear obtained. Consent. Recheck in 6 months. WSL

## 2013-04-11 LAB — PAP IG W/ RFLX HPV ASCU

## 2013-04-12 ENCOUNTER — Encounter: Payer: Self-pay | Admitting: Family Medicine

## 2013-04-20 ENCOUNTER — Other Ambulatory Visit: Payer: Self-pay | Admitting: *Deleted

## 2013-04-20 DIAGNOSIS — Z0000 Encounter for general adult medical examination without abnormal findings: Secondary | ICD-10-CM

## 2013-04-20 LAB — POC HEMOCCULT BLD/STL (HOME/3-CARD/SCREEN)
Card #3 Fecal Occult Blood, POC: NEGATIVE
Fecal Occult Blood, POC: NEGATIVE

## 2013-10-28 ENCOUNTER — Encounter (HOSPITAL_COMMUNITY): Payer: Self-pay | Admitting: Emergency Medicine

## 2013-10-28 ENCOUNTER — Emergency Department (HOSPITAL_COMMUNITY)
Admission: EM | Admit: 2013-10-28 | Discharge: 2013-10-28 | Disposition: A | Discharge status: Home / Self Care | Payer: Medicare PPO | Attending: Emergency Medicine | Admitting: Emergency Medicine

## 2013-10-28 DIAGNOSIS — Z8739 Personal history of other diseases of the musculoskeletal system and connective tissue: Secondary | ICD-10-CM | POA: Insufficient documentation

## 2013-10-28 DIAGNOSIS — Z8669 Personal history of other diseases of the nervous system and sense organs: Secondary | ICD-10-CM | POA: Insufficient documentation

## 2013-10-28 DIAGNOSIS — R42 Dizziness and giddiness: Secondary | ICD-10-CM | POA: Insufficient documentation

## 2013-10-28 DIAGNOSIS — Z79899 Other long term (current) drug therapy: Secondary | ICD-10-CM | POA: Insufficient documentation

## 2013-10-28 DIAGNOSIS — H9319 Tinnitus, unspecified ear: Secondary | ICD-10-CM | POA: Insufficient documentation

## 2013-10-28 DIAGNOSIS — R110 Nausea: Secondary | ICD-10-CM | POA: Insufficient documentation

## 2013-10-28 DIAGNOSIS — J45909 Unspecified asthma, uncomplicated: Secondary | ICD-10-CM | POA: Insufficient documentation

## 2013-10-28 MED ORDER — MECLIZINE HCL 50 MG PO TABS: 50.0000 mg | ORAL_TABLET | Freq: Three times a day (TID) | ORAL | Status: DC | PRN

## 2013-10-28 MED ORDER — ONDANSETRON 4 MG PO TBDP: 4.0000 mg | ORAL_TABLET | Freq: Three times a day (TID) | ORAL | Status: DC | PRN

## 2013-10-28 MED ORDER — MECLIZINE HCL 12.5 MG PO TABS
25.0000 mg | ORAL_TABLET | Freq: Once | ORAL | Status: AC
  Administered 2013-10-28: 25 mg via ORAL
  Filled 2013-10-28: qty 2

## 2013-10-28 MED ORDER — DIPHENHYDRAMINE HCL 50 MG/ML IJ SOLN
25.0000 mg | Freq: Once | INTRAMUSCULAR | Status: AC
  Administered 2013-10-28: 25 mg via INTRAMUSCULAR
  Filled 2013-10-28: qty 1

## 2013-10-28 MED ORDER — ACETAMINOPHEN 325 MG PO TABS
650.0000 mg | ORAL_TABLET | Freq: Once | ORAL | Status: AC
  Administered 2013-10-28: 650 mg via ORAL
  Filled 2013-10-28: qty 2

## 2013-10-28 MED ORDER — ONDANSETRON 4 MG PO TBDP
4.0000 mg | ORAL_TABLET | Freq: Once | ORAL | Status: AC
  Administered 2013-10-28: 4 mg via ORAL
  Filled 2013-10-28: qty 1

## 2013-10-28 NOTE — ED Notes (Signed)
Patient with no complaints at this time. Respirations even and unlabored. Skin warm/dry. Discharge instructions reviewed with patient at this time. Patient given opportunity to voice concerns/ask questions. Patient discharged at this time and left Emergency Department with steady gait.   

## 2013-10-28 NOTE — ED Notes (Signed)
PT c/o increased dizziness x2 days with some nausea. PT also c/o ringing in her ears at times.

## 2013-10-28 NOTE — ED Notes (Signed)
Given ginger ale for PO challenge.

## 2013-10-28 NOTE — Discharge Instructions (Signed)
Take medicines as prescribed, until 24 hours without vertigo. No driving until 24 hours without vertigo.   Benign Positional Vertigo Vertigo means you feel like you or your surroundings are moving when they are not. Benign positional vertigo is the most common form of vertigo. Benign means that the cause of your condition is not serious. Benign positional vertigo is more common in older adults. CAUSES  Benign positional vertigo is the result of an upset in the labyrinth system. This is an area in the middle ear that helps control your balance. This may be caused by a viral infection, head injury, or repetitive motion. However, often no specific cause is found. SYMPTOMS  Symptoms of benign positional vertigo occur when you move your head or eyes in different directions. Some of the symptoms may include:  Loss of balance and falls.  Vomiting.  Blurred vision.  Dizziness.  Nausea.  Involuntary eye movements (nystagmus). DIAGNOSIS  Benign positional vertigo is usually diagnosed by physical exam. If the specific cause of your benign positional vertigo is unknown, your caregiver may perform imaging tests, such as magnetic resonance imaging (MRI) or computed tomography (CT). TREATMENT  Your caregiver may recommend movements or procedures to correct the benign positional vertigo. Medicines such as meclizine, benzodiazepines, and medicines for nausea may be used to treat your symptoms. In rare cases, if your symptoms are caused by certain conditions that affect the inner ear, you may need surgery. HOME CARE INSTRUCTIONS   Follow your caregiver's instructions.  Move slowly. Do not make sudden body or head movements.  Avoid driving.  Avoid operating heavy machinery.  Avoid performing any tasks that would be dangerous to you or others during a vertigo episode.  Drink enough fluids to keep your urine clear or pale yellow. SEEK IMMEDIATE MEDICAL CARE IF:   You develop problems with  walking, weakness, numbness, or using your arms, hands, or legs.  You have difficulty speaking.  You develop severe headaches.  Your nausea or vomiting continues or gets worse.  You develop visual changes.  Your family or friends notice any behavioral changes.  Your condition gets worse.  You have a fever.  You develop a stiff neck or sensitivity to light. MAKE SURE YOU:   Understand these instructions.  Will watch your condition.  Will get help right away if you are not doing well or get worse. Document Released: 01/25/2006 Document Revised: 07/12/2011 Document Reviewed: 01/07/2011 [facility] Patient Information 2015 [place], [place]. This information is not intended to replace advice given to you by your health care provider. Make sure you discuss any questions you have with your health care provider.

## 2013-10-28 NOTE — ED Provider Notes (Signed)
CSN: [id number]     Arrival date & time 10/28/13  0807 History  This chart was scribed for Tanna Furry, MD by Elby Beck, ED Scribe. This patient was seen in room APA14/APA14 and the patient's care was started at 8:28 AM.   Chief Complaint  Patient presents with  . Dizziness    The history is provided by the patient. No language interpreter was used.    HPI Comments: Ariel Cook is a 68 y.o. female who presents to the Emergency Department complaining of intermittent dizziness over the past 2 days that worsened at 6:30 AM this morning. She describes her dizziness as a spinning sensation that is worsened with closing her eyes. She reports associated tinnitus in her bilateral ears and nausea with dry heaves. She states that she has a history of similar episodes of dizziness in the past and that she was prescribed Antivert in the past. She states that she has a history of chronic neck and back pain, with no recent changes. She states that she is allergic to Sulfa-based drugs. She also states that she can not take Valium or any pain medications other than Tylenol or Motrin.   Past Medical History  Diagnosis Date  . Unspecified adverse effect of anesthesia     CAUSES ASTHMA TO EXACERBATE, CAUSES MIGRAINES  . PONV (postoperative nausea and vomiting)   . Asthma     USES ALBUTEROL INHALER  . Headache(784.0)   . Sciatica   . DDD (degenerative disc disease), cervical   . Left carpal tunnel syndrome 09/03/2011   Past Surgical History  Procedure Laterality Date  . Tubal ligation  1976  . Eye surgery  2011    BIL CATARACT  . Appendectomy  2010  . Carpal tunnel release  2010    RT  . Carpal tunnel release  09/03/2011    Procedure: CARPAL TUNNEL RELEASE;  Surgeon: Johnny Bridge, MD;  Location: [facility];  Service: Orthopedics;  Laterality: Left;   Family History  Problem Relation Age of Onset  . Hypertension Mother   . Heart attack Father   . Breast cancer Sister     History  Substance Use Topics  . Smoking status: Never Smoker   . Smokeless tobacco: Not on file  . Alcohol Use: No   OB History   Grav Para Term Preterm Abortions TAB SAB Ect Mult Living                 Review of Systems  Constitutional: Negative for fever, chills, diaphoresis, appetite change and fatigue.  HENT: Positive for tinnitus. Negative for mouth sores, sore throat and trouble swallowing.   Eyes: Negative for visual disturbance.  Respiratory: Negative for cough, chest tightness, shortness of breath and wheezing.   Cardiovascular: Negative for chest pain.  Gastrointestinal: Positive for nausea. Negative for vomiting, abdominal pain, diarrhea and abdominal distention.  Endocrine: Negative for polydipsia, polyphagia and polyuria.  Genitourinary: Negative for dysuria, frequency and hematuria.  Musculoskeletal: Negative for gait problem.  Skin: Negative for color change, pallor and rash.  Neurological: Positive for dizziness. Negative for syncope, light-headedness and headaches.  Hematological: Does not bruise/bleed easily.  Psychiatric/Behavioral: Negative for behavioral problems and confusion.    Allergies  Sulfa antibiotics and Stadol  Home Medications   Prior to Admission medications   Medication Sig Start Date End Date Taking? Authorizing Provider  acetaminophen (TYLENOL) 650 MG CR tablet Take 650 mg by mouth at bedtime.   Yes Historical Provider, MD  albuterol (PROVENTIL HFA;VENTOLIN HFA) 108 (90 BASE) MCG/ACT inhaler Inhale 2 puffs into the lungs every 6 (six) hours as needed for wheezing or shortness of breath.    Yes Historical Provider, MD  cholecalciferol (VITAMIN D) 1000 UNITS tablet Take 4,000 Units by mouth daily.   Yes Historical Provider, MD  fluticasone (FLOVENT HFA) 110 MCG/ACT inhaler Inhale 2 puffs into the lungs 2 (two) times daily. 04/09/13  Yes Mikey Kirschner, MD  triamterene-hydrochlorothiazide (DYAZIDE) 37.5-25 MG per capsule TAKE ONE CAPSULE BY  MOUTH DAILY 04/09/13  Yes Mikey Kirschner, MD  meclizine (ANTIVERT) 50 MG tablet Take 1 tablet (50 mg total) by mouth 3 (three) times daily as needed. 10/28/13   Tanna Furry, MD  ondansetron (ZOFRAN ODT) 4 MG disintegrating tablet Take 1 tablet (4 mg total) by mouth every 8 (eight) hours as needed for nausea. 10/28/13   Tanna Furry, MD   Triage Vitals: BP 120/80  Pulse 64  Temp(Src) 97.4 F (36.3 C) (Oral)  Resp 18  Ht 5' (1.524 m)  Wt 160 lb (72.576 kg)  BMI 31.25 kg/m2  SpO2 99%  Physical Exam  Nursing note and vitals reviewed. Constitutional: She is oriented to person, place, and time. She appears well-developed and well-nourished. No distress.  HENT:  Head: Normocephalic.  Eyes: Conjunctivae are normal. Pupils are equal, round, and reactive to light. No scleral icterus.  Horizontal nystagmus in both directions  Neck: Normal range of motion. Neck supple. No thyromegaly present.  Cardiovascular: Normal rate and regular rhythm.  Exam reveals no gallop and no friction rub.   No murmur heard. Pulmonary/Chest: Effort normal and breath sounds normal. No respiratory distress. She has no wheezes. She has no rales.  Abdominal: Soft. Bowel sounds are normal. She exhibits no distension. There is no tenderness. There is no rebound.  Musculoskeletal: Normal range of motion.  Neurological: She is alert and oriented to person, place, and time.  Skin: Skin is warm and dry. No rash noted.  Psychiatric: She has a normal mood and affect. Her behavior is normal.    ED Course  Procedures (including critical care time)  DIAGNOSTIC STUDIES: Oxygen Saturation is 99% on RA, normal by my interpretation.    COORDINATION OF CARE: 8:31 AM- Discussed plan to order medications. Pt advised of plan for treatment and pt agrees.  Labs Review Labs Reviewed - No data to display  Imaging Review No results found.   EKG Interpretation None      MDM   Final diagnoses:  Vertigo     Vertigo improved  but not resolved. Nausea resolved and taking by mouth. Plan we discharged home. Antivert and Zofran. Vertigo precautions. No driving until 24 hours without symptoms. Take medications until 24 hours without symptoms.  I personally performed the services described in this documentation, which was scribed in my presence. The recorded information has been reviewed and is accurate.   Tanna Furry, MD 10/28/13 1012

## 2013-12-30 ENCOUNTER — Other Ambulatory Visit: Payer: Self-pay | Admitting: Family Medicine

## 2014-02-07 ENCOUNTER — Ambulatory Visit (HOSPITAL_COMMUNITY)
Admission: RE | Admit: 2014-02-07 | Discharge: 2014-02-07 | Disposition: A | Discharge status: Home / Self Care | Payer: Medicare PPO | Source: Ambulatory Visit | Attending: Family Medicine | Admitting: Family Medicine

## 2014-02-07 ENCOUNTER — Encounter: Payer: Self-pay | Admitting: Family Medicine

## 2014-02-07 ENCOUNTER — Ambulatory Visit (INDEPENDENT_AMBULATORY_CARE_PROVIDER_SITE_OTHER): Payer: Medicare PPO | Admitting: Family Medicine

## 2014-02-07 VITALS — BP 120/80 | Temp 98.2°F | Ht 61.0 in | Wt 161.0 lb

## 2014-02-07 DIAGNOSIS — G8929 Other chronic pain: Secondary | ICD-10-CM

## 2014-02-07 DIAGNOSIS — J4520 Mild intermittent asthma, uncomplicated: Secondary | ICD-10-CM

## 2014-02-07 DIAGNOSIS — M5032 Other cervical disc degeneration, mid-cervical region: Secondary | ICD-10-CM | POA: Diagnosis not present

## 2014-02-07 DIAGNOSIS — M545 Low back pain, unspecified: Secondary | ICD-10-CM

## 2014-02-07 DIAGNOSIS — M5136 Other intervertebral disc degeneration, lumbar region: Secondary | ICD-10-CM | POA: Diagnosis not present

## 2014-02-07 DIAGNOSIS — Z23 Encounter for immunization: Secondary | ICD-10-CM

## 2014-02-07 DIAGNOSIS — M25512 Pain in left shoulder: Secondary | ICD-10-CM | POA: Insufficient documentation

## 2014-02-07 DIAGNOSIS — Z139 Encounter for screening, unspecified: Secondary | ICD-10-CM

## 2014-02-07 DIAGNOSIS — Z79899 Other long term (current) drug therapy: Secondary | ICD-10-CM

## 2014-02-07 DIAGNOSIS — M542 Cervicalgia: Secondary | ICD-10-CM | POA: Diagnosis not present

## 2014-02-07 DIAGNOSIS — G629 Polyneuropathy, unspecified: Secondary | ICD-10-CM

## 2014-02-07 DIAGNOSIS — G609 Hereditary and idiopathic neuropathy, unspecified: Secondary | ICD-10-CM

## 2014-02-07 DIAGNOSIS — R5383 Other fatigue: Secondary | ICD-10-CM

## 2014-02-07 MED ORDER — TRIAMTERENE-HCTZ 37.5-25 MG PO CAPS: ORAL_CAPSULE | ORAL | Status: DC

## 2014-02-07 MED ORDER — DICLOFENAC SODIUM 75 MG PO TBEC: 75.0000 mg | DELAYED_RELEASE_TABLET | Freq: Two times a day (BID) | ORAL | Status: DC

## 2014-02-07 MED ORDER — FLUTICASONE PROPIONATE HFA 110 MCG/ACT IN AERO: 2.0000 | INHALATION_SPRAY | Freq: Two times a day (BID) | RESPIRATORY_TRACT | Status: DC

## 2014-02-07 NOTE — Progress Notes (Signed)
   Subjective:    Patient ID: Ariel Cook, female    DOB: 1945/12/07, 68 y.o.   MRN: [id number]  HPI Patient states she has sharp pains in her feet and toes. Seems to be worse in the evening. Often accompanied by a tingling sensation.   Patient also complains of pain in her neck/shoulder/head area also. This has been present for about 2-3 months. Patient has used heat compresses with mild relief. Pain is diffuse in the posterior. Worse with motions.  Patient states that her asthma has been flaring up also.  Uses flovent just once per day, feels that two per day causes swelling  Uses ventlin or albuterol only rarely  Patient also notes low back pain. Chronic in nature diffuse. Worse with certain motions. Over-the-counter medications not helping much. Needs a refill on Triamterene/HCTZ, takes diuretic e ry single day still notes some swelling at times.  Maybe helping some,.  Also chronic shoulder pain. Worsening. Unresponsive to over-the-counter medications.  Patient also notes frequent feeling of fatigue.  Still having challenges having lost her husband a couple years ago. Increased stress at times. Patient uses over-the-counter glucosamine with mild help with joint pain. Pt recently ran out, and now using   and inhaler.  Feet pulses good no significant edema sensation somewhat diminished distally  Review of Systems No abdominal pain no change in bowel habits no blood in stool no rash    Objective:   Physical Exam Alert moderate malaise. Blood pressure good. Vital stable. HEENT normal. Lungs currently clear. Heart regular in rhythm. Ankles no true edema. Neck some tenderness with rotation. Low back tenderness palpation. Negative straight leg raise. Shoulder also painful with rotation. Some positive impingement sign noted.  Feet pulses good no significant edema sensation somewhat diminished distally     Assessment & Plan:  Impression #1 chronic low back pain #2 chronic neck  pain. #3 shoulder pain with elements of impingement sign. #4 asthma fair control. #5 chronic edema fair control per patient good control per our exam. #6 Gen. fatigue. Plan flu vaccine discussed. Appropriate blood work. There've conduction studies. Diet exercise discussed. Trial Voltaren. Dyazide refilled. Easily 40 minutes spent most in discussion. WSL

## 2014-02-08 LAB — LIPID PANEL
CHOLESTEROL: 207 mg/dL — AB (ref 0–200)
HDL: 74 mg/dL (ref >39)
LDL CALC: 117 mg/dL — AB (ref 0–99)
TRIGLYCERIDES: 80 mg/dL (ref <150)
Total CHOL/HDL Ratio: 2.8 Ratio
VLDL: 16 mg/dL (ref 0–40)

## 2014-02-08 LAB — HEPATIC FUNCTION PANEL
ALT: 16 U/L (ref 0–35)
AST: 20 U/L (ref 0–37)
Albumin: 4.4 g/dL (ref 3.5–5.2)
Alkaline Phosphatase: 89 U/L (ref 39–117)
BILIRUBIN INDIRECT: 0.6 mg/dL (ref 0.2–1.2)
Bilirubin, Direct: 0.1 mg/dL (ref 0.0–0.3)
TOTAL PROTEIN: 6.8 g/dL (ref 6.0–8.3)
Total Bilirubin: 0.7 mg/dL (ref 0.2–1.2)

## 2014-02-08 LAB — BASIC METABOLIC PANEL
BUN: 8 mg/dL (ref 6–23)
CHLORIDE: 99 meq/L (ref 96–112)
CO2: 26 mEq/L (ref 19–32)
Calcium: 9.8 mg/dL (ref 8.4–10.5)
Creat: 0.91 mg/dL (ref 0.50–1.10)
Glucose, Bld: 89 mg/dL (ref 70–99)
Potassium: 4 mEq/L (ref 3.5–5.3)
Sodium: 138 mEq/L (ref 135–145)

## 2014-02-08 LAB — FOLATE

## 2014-02-08 LAB — VITAMIN B12: Vitamin B-12: 265 pg/mL (ref 211–911)

## 2014-02-12 ENCOUNTER — Telehealth: Payer: Self-pay | Admitting: Family Medicine

## 2014-02-12 DIAGNOSIS — M545 Low back pain, unspecified: Secondary | ICD-10-CM | POA: Insufficient documentation

## 2014-02-12 DIAGNOSIS — G609 Hereditary and idiopathic neuropathy, unspecified: Secondary | ICD-10-CM | POA: Insufficient documentation

## 2014-02-12 DIAGNOSIS — G8929 Other chronic pain: Secondary | ICD-10-CM | POA: Insufficient documentation

## 2014-02-12 NOTE — Telephone Encounter (Signed)
Calling to get results to lab work. °

## 2014-03-08 ENCOUNTER — Encounter: Payer: Self-pay | Admitting: Family Medicine

## 2014-03-08 ENCOUNTER — Ambulatory Visit (INDEPENDENT_AMBULATORY_CARE_PROVIDER_SITE_OTHER): Payer: Medicare PPO | Admitting: Family Medicine

## 2014-03-08 VITALS — BP 144/90 | Ht 61.0 in | Wt 166.0 lb

## 2014-03-08 DIAGNOSIS — I878 Other specified disorders of veins: Secondary | ICD-10-CM

## 2014-03-08 DIAGNOSIS — J4520 Mild intermittent asthma, uncomplicated: Secondary | ICD-10-CM

## 2014-03-08 DIAGNOSIS — M545 Low back pain: Secondary | ICD-10-CM

## 2014-03-08 DIAGNOSIS — G8929 Other chronic pain: Secondary | ICD-10-CM

## 2014-03-08 DIAGNOSIS — G609 Hereditary and idiopathic neuropathy, unspecified: Secondary | ICD-10-CM

## 2014-03-08 DIAGNOSIS — M47812 Spondylosis without myelopathy or radiculopathy, cervical region: Secondary | ICD-10-CM

## 2014-03-08 MED ORDER — ALBUTEROL SULFATE HFA 108 (90 BASE) MCG/ACT IN AERS: 2.0000 | INHALATION_SPRAY | Freq: Four times a day (QID) | RESPIRATORY_TRACT | Status: DC | PRN

## 2014-03-08 MED ORDER — PREDNISONE 10 MG PO TABS: ORAL_TABLET | ORAL | Status: DC

## 2014-03-08 NOTE — Progress Notes (Signed)
   Subjective:    Patient ID: Ariel Cook, female    DOB: [birth year], 68 y.o.   MRN: [id number]  HPI Patient is here today for a 4 week f/u.  She said she does not notice any difference with the diclofenac.   She is still in pain., worse in the neck last couple days flaring  alo wheezing flaring up too, patient claims compliance with her steroid inhaler  Ongoing fatigue and diminished energy. Chronic in nature.  Continues to have issues of numb feet. Has not had neurology workup for sensory neuropathy yet.  Not exercising much these days  Patient also has left shoulder discomfort with certain motions. Please see prior note.  She wants a refill/rx for a rescue inhaler.   Some neck and back pain persisting.  Review of Systems    no headache no chest pain no back pain no change bowel habits no blood in stool Objective:   Physical Exam Alert HEENT slight nasal congestion lungs trace wheeze no tachypnea heart regular in rhythm left shoulder positive impingement sign ankles without edema.       Assessment & Plan:  Impression 1 asthma decent control #2 chronic arthritis discuss. Patient wishes to stay with current medication. #3 neuropathy workup not yet complete #4 left impingement sign shoulder if persists may need orthopedic discussed plan diet exercise discussed maintain same medications. Press on with neuropathy workup. Of note blood work was negative for this. WSL

## 2014-03-10 DIAGNOSIS — M47812 Spondylosis without myelopathy or radiculopathy, cervical region: Secondary | ICD-10-CM | POA: Insufficient documentation

## 2014-03-19 ENCOUNTER — Ambulatory Visit (INDEPENDENT_AMBULATORY_CARE_PROVIDER_SITE_OTHER): Payer: Medicare PPO | Admitting: Neurology

## 2014-03-19 ENCOUNTER — Ambulatory Visit (INDEPENDENT_AMBULATORY_CARE_PROVIDER_SITE_OTHER): Payer: Self-pay | Admitting: Radiology

## 2014-03-19 DIAGNOSIS — M545 Low back pain: Secondary | ICD-10-CM

## 2014-03-19 DIAGNOSIS — Z0289 Encounter for other administrative examinations: Secondary | ICD-10-CM

## 2014-03-19 DIAGNOSIS — R202 Paresthesia of skin: Secondary | ICD-10-CM

## 2014-03-19 DIAGNOSIS — R209 Unspecified disturbances of skin sensation: Principal | ICD-10-CM

## 2014-03-19 DIAGNOSIS — IMO0001 Reserved for inherently not codable concepts without codable children: Secondary | ICD-10-CM

## 2014-03-19 NOTE — Procedures (Signed)
   NCS (NERVE CONDUCTION STUDY) WITH EMG (ELECTROMYOGRAPHY) REPORT   STUDY DATE: March 19 2014 PATIENT NAME: Ariel Cook DOB: May 29, 1945 MRN: [id number]    TECHNOLOGIST:Sue Fox ELECTROMYOGRAPHER: Marcial Pacas M.D.  CLINICAL INFORMATION:  68 years old female,with past medical history of chronic low back pain, scoliosis, presenting with worsening low back pain, bilateral feet paresthesia or for a few months, right worse than left, also shooting pain from right back to right leg.  On examination: Bilateral lower extremity motor strength is normal, deep tendon reflex was normal and symmetric, sensory examination was intact to light touch.  FINDINGS: NERVE CONDUCTION STUDY: Bilateral sural sensory responses were normal.  Bilateral tibial, peroneal motor responses were normal.  NEEDLE ELECTROMYOGRAPHY: Selected needle examination was performed at right lower extremity muscles, right lumbosacral paraspinal muscles.  Needle examination of right tibialis anterior, tibialis posterior, peroneal longus, vastus lateralis, biceps femoris short head,  Biceps femoris long head was normal  There was no spontaneous activity at right lumbosacral paraspinal muscles, right L4, L5,  IMPRESSION:   This is a normal study. There was no electrodiagnostic evidence of large fiber peripheral neuropathy, or right lumbosacral radiculopathy. But current study could not rule out the possibility of small fiber neuropathy, because of technical limitations, if indicated, may consider skin biopsy.   INTERPRETING PHYSICIAN:   Marcial Pacas M.D. Ph.D. [facility] [address], [place], [place] [address] [phone]

## 2014-03-20 ENCOUNTER — Encounter: Payer: Self-pay | Admitting: Neurology

## 2014-08-27 ENCOUNTER — Telehealth: Payer: Self-pay | Admitting: Family Medicine

## 2014-08-27 NOTE — Telephone Encounter (Signed)
Pt is needing lab orders to be sent over for appt on Tues. Pt is going out of  [place] on [employer] and is wanting to get the labs done before then. Last labs per epic were: lipid,hepatic,bmp,vitamin b12 and folate on 02/07/14

## 2014-08-27 NOTE — Telephone Encounter (Signed)
With bw just done in oct, needs o v only

## 2014-08-27 NOTE — Telephone Encounter (Signed)
Patient notified

## 2014-09-03 ENCOUNTER — Ambulatory Visit (INDEPENDENT_AMBULATORY_CARE_PROVIDER_SITE_OTHER): Payer: Medicare PPO | Admitting: Family Medicine

## 2014-09-03 ENCOUNTER — Other Ambulatory Visit: Payer: Self-pay | Admitting: Family Medicine

## 2014-09-03 ENCOUNTER — Encounter: Payer: Self-pay | Admitting: Family Medicine

## 2014-09-03 VITALS — BP 138/80 | Ht 60.0 in | Wt 159.0 lb

## 2014-09-03 DIAGNOSIS — Z0000 Encounter for general adult medical examination without abnormal findings: Secondary | ICD-10-CM | POA: Diagnosis not present

## 2014-09-03 DIAGNOSIS — J4520 Mild intermittent asthma, uncomplicated: Secondary | ICD-10-CM

## 2014-09-03 DIAGNOSIS — Z1239 Encounter for other screening for malignant neoplasm of breast: Secondary | ICD-10-CM | POA: Diagnosis not present

## 2014-09-03 DIAGNOSIS — Z1231 Encounter for screening mammogram for malignant neoplasm of breast: Secondary | ICD-10-CM

## 2014-09-03 DIAGNOSIS — R079 Chest pain, unspecified: Secondary | ICD-10-CM | POA: Diagnosis not present

## 2014-09-03 MED ORDER — FLUTICASONE PROPIONATE HFA 110 MCG/ACT IN AERO: 2.0000 | INHALATION_SPRAY | Freq: Two times a day (BID) | RESPIRATORY_TRACT | Status: DC

## 2014-09-03 MED ORDER — ALBUTEROL SULFATE HFA 108 (90 BASE) MCG/ACT IN AERS: 2.0000 | INHALATION_SPRAY | Freq: Four times a day (QID) | RESPIRATORY_TRACT | Status: DC | PRN

## 2014-09-03 NOTE — Progress Notes (Signed)
Subjective:    Patient ID: Ariel Cook, female    DOB: [birth year], 69 y.o.   MRN: [id number]  HPI The patient comes in today for a wellness visit.    A review of their health history was completed.  A review of medications was also completed.  Any needed refills; none today  Eating habits: not healthy. Eats one meal a day  Falls/  MVA accidents in past few months: none  Regular exercise: works in yard some  Specialist pt sees on regular basis: none  Preventative health issues were discussed.   Additional concerns: Chest pain off and on for awhile. Worse the past two weeks.  ch pain can come on at any tinme, some sig cad in the family sister m mo father  Sharp chest pain ant chest, sharp in nature hits hard. Generally last a few seconds.  Asthma tight at times, not wheezing that much, uses steroid inhaler fairly regularly  Uses albuterol sporadically  Chronic neck discomfort with stiffness, hurts to turn head one way or the other  Left breast "lump', but on further history he notes diffuse tenderness in both breasts. States this is been a chronic long-term problem. Has not had a mammogram in the last couple years.  Feels a bit hard to breathe, but no nausea or no diaphoresis  See  Review of Systems  Constitutional: Negative for activity change, appetite change and fatigue.       Notes generalized fatigue  HENT: Negative for congestion, ear discharge and rhinorrhea.   Eyes: Negative for discharge.  Respiratory: Negative for cough, chest tightness and wheezing.        History asthma see present illness  Cardiovascular: Negative for chest pain.       Some chest pain see present illness transient sharp fleeting  Gastrointestinal: Negative for vomiting and abdominal pain.  Genitourinary: Negative for frequency and difficulty urinating.  Musculoskeletal: Negative for neck pain.  Allergic/Immunologic: Negative for environmental allergies and food allergies.    Neurological: Negative for weakness and headaches.  Psychiatric/Behavioral: Negative for behavioral problems and agitation.  All other systems reviewed and are negative.      Objective:   Physical Exam  Constitutional: She is oriented to person, place, and time. She appears well-developed and well-nourished.  HENT:  Head: Normocephalic.  Right Ear: External ear normal.  Left Ear: External ear normal.  Eyes: Pupils are equal, round, and reactive to light.  Neck: Normal range of motion. No thyromegaly present.  Cardiovascular: Normal rate, regular rhythm, normal heart sounds and intact distal pulses.   No murmur heard. Pulmonary/Chest: Effort normal and breath sounds normal. No respiratory distress. She has no wheezes.  Abdominal: Soft. Bowel sounds are normal. She exhibits no distension and no mass. There is no tenderness.  Genitourinary: Vagina normal and uterus normal.  Breasts diffuse fine nodularity. No focal masses palpated. Diffuse tenderness noted  Musculoskeletal: Normal range of motion. She exhibits no edema or tenderness.  Lymphadenopathy:    She has no cervical adenopathy.  Neurological: She is alert and oriented to person, place, and time. She exhibits normal muscle tone.  Skin: Skin is warm and dry.  Psychiatric: She has a normal mood and affect. Her behavior is normal.  Vitals reviewed.   Electrocardiogram normal sinus rhythm no significant ST T changes.      Assessment & Plan:  Impression 1 wellness exam #2 chest pain sharp transient anterior likely chest wall discussed with patient. #3 asthma clinically stable #4  diffuse breast tenderness  bilateral with no focal lumps palpated. Plan strongly encouraged to get mammogram., Pap smear sent. Hemoccult cards. Not time for colonoscopy yet. Diet exercise discussed. WSL

## 2014-09-04 ENCOUNTER — Other Ambulatory Visit: Payer: Self-pay | Admitting: Family Medicine

## 2014-09-04 ENCOUNTER — Ambulatory Visit (HOSPITAL_COMMUNITY): Admission: RE | Admit: 2014-09-04 | Payer: Medicare PPO | Source: Ambulatory Visit

## 2014-09-04 DIAGNOSIS — N6320 Unspecified lump in the left breast, unspecified quadrant: Secondary | ICD-10-CM

## 2014-09-05 LAB — PAP IG W/ RFLX HPV ASCU: PAP Smear Comment: 0

## 2014-09-10 ENCOUNTER — Other Ambulatory Visit: Payer: Self-pay | Admitting: Family Medicine

## 2014-09-10 ENCOUNTER — Ambulatory Visit (HOSPITAL_COMMUNITY)
Admission: RE | Admit: 2014-09-10 | Discharge: 2014-09-10 | Disposition: A | Discharge status: Home / Self Care | Payer: Medicare PPO | Source: Ambulatory Visit | Attending: Family Medicine | Admitting: Family Medicine

## 2014-09-10 ENCOUNTER — Telehealth: Payer: Self-pay | Admitting: Family Medicine

## 2014-09-10 DIAGNOSIS — N63 Unspecified lump in breast: Secondary | ICD-10-CM | POA: Diagnosis not present

## 2014-09-10 DIAGNOSIS — N6320 Unspecified lump in the left breast, unspecified quadrant: Secondary | ICD-10-CM

## 2014-09-10 NOTE — Telephone Encounter (Signed)
Pt seen 09/03/14, nurse gave me copy of EKG with a note that a referral would be coming.  Referral never showed up in my "Q".  Nothing in office note that day about a referral to cardio, please advise.  If referral is needed please initiate in system so that I may process

## 2014-09-13 NOTE — Telephone Encounter (Signed)
Nurses Let me spk to pt,

## 2014-09-13 NOTE — Telephone Encounter (Signed)
Dr. Richardson Landry spoke with pt.

## 2014-09-17 ENCOUNTER — Other Ambulatory Visit: Payer: Self-pay | Admitting: *Deleted

## 2014-09-17 DIAGNOSIS — Z0000 Encounter for general adult medical examination without abnormal findings: Secondary | ICD-10-CM

## 2014-09-17 LAB — POC HEMOCCULT BLD/STL (HOME/3-CARD/SCREEN)
CARD #2 DATE: 5112016
Card #3 Date: 5122016
Card #3 Fecal Occult Blood, POC: NEGATIVE
FECAL OCCULT BLD: NEGATIVE
Fecal Occult Blood, POC: NEGATIVE
OCCULT BLOOD DATE: 5102016

## 2014-09-25 ENCOUNTER — Encounter: Payer: Self-pay | Admitting: Family Medicine

## 2014-10-28 ENCOUNTER — Other Ambulatory Visit: Payer: Self-pay

## 2015-01-09 ENCOUNTER — Encounter: Payer: Self-pay | Admitting: Family Medicine

## 2015-01-09 ENCOUNTER — Ambulatory Visit (INDEPENDENT_AMBULATORY_CARE_PROVIDER_SITE_OTHER): Payer: Medicare PPO | Admitting: Family Medicine

## 2015-01-09 ENCOUNTER — Ambulatory Visit (HOSPITAL_COMMUNITY)
Admission: RE | Admit: 2015-01-09 | Discharge: 2015-01-09 | Disposition: A | Discharge status: Home / Self Care | Payer: Medicare PPO | Source: Ambulatory Visit | Attending: Family Medicine | Admitting: Family Medicine

## 2015-01-09 VITALS — Ht 60.0 in | Wt 159.0 lb

## 2015-01-09 DIAGNOSIS — R0602 Shortness of breath: Secondary | ICD-10-CM | POA: Insufficient documentation

## 2015-01-09 DIAGNOSIS — M549 Dorsalgia, unspecified: Secondary | ICD-10-CM | POA: Insufficient documentation

## 2015-01-09 DIAGNOSIS — J45909 Unspecified asthma, uncomplicated: Secondary | ICD-10-CM | POA: Diagnosis not present

## 2015-01-09 DIAGNOSIS — R0781 Pleurodynia: Secondary | ICD-10-CM | POA: Diagnosis not present

## 2015-01-09 DIAGNOSIS — J45901 Unspecified asthma with (acute) exacerbation: Secondary | ICD-10-CM | POA: Insufficient documentation

## 2015-01-09 DIAGNOSIS — R079 Chest pain, unspecified: Secondary | ICD-10-CM | POA: Diagnosis not present

## 2015-01-09 DIAGNOSIS — J4541 Moderate persistent asthma with (acute) exacerbation: Secondary | ICD-10-CM | POA: Diagnosis not present

## 2015-01-09 DIAGNOSIS — R062 Wheezing: Secondary | ICD-10-CM | POA: Diagnosis not present

## 2015-01-09 MED ORDER — FLUTICASONE PROPIONATE HFA 110 MCG/ACT IN AERO: 2.0000 | INHALATION_SPRAY | Freq: Two times a day (BID) | RESPIRATORY_TRACT | Status: DC

## 2015-01-09 MED ORDER — ALBUTEROL SULFATE (2.5 MG/3ML) 0.083% IN NEBU
2.5000 mg | INHALATION_SOLUTION | Freq: Once | RESPIRATORY_TRACT | Status: AC
  Administered 2015-01-09: 2.5 mg via RESPIRATORY_TRACT

## 2015-01-09 MED ORDER — PREDNISONE 20 MG PO TABS: ORAL_TABLET | ORAL | Status: DC

## 2015-01-09 MED ORDER — ALBUTEROL SULFATE HFA 108 (90 BASE) MCG/ACT IN AERS: 2.0000 | INHALATION_SPRAY | Freq: Four times a day (QID) | RESPIRATORY_TRACT | Status: DC | PRN

## 2015-01-09 NOTE — Progress Notes (Signed)
   Subjective:    Patient ID: Ariel Cook, female    DOB: May 26, 1945, 69 y.o.   MRN: [id number]  Shortness of Breath Associated symptoms include chest pain and wheezing. Pertinent negatives include no ear pain, fever, hemoptysis, rhinorrhea or sputum production. Her past medical history is significant for asthma.  Asthma She complains of chest tightness, cough, difficulty breathing, shortness of breath and wheezing. There is no frequent throat clearing, hemoptysis or sputum production. This is a chronic problem. The current episode started yesterday. The problem occurs constantly. The problem has been rapidly worsening. The cough is non-productive. Associated symptoms include chest pain and dyspnea on exertion. Pertinent negatives include no ear pain, fever, nasal congestion, orthopnea, rhinorrhea or sneezing. Her symptoms are alleviated by beta-agonist. She reports significant improvement on treatment. Her past medical history is significant for asthma.   patient states this certainly came on over the past couple days she tried her albuterol at home without any success she thinks it might be out of date. She also states she's only been using the steroid inhaler once daily twice. Patient has a history of asthma.   Review of Systems  Constitutional: Positive for fatigue. Negative for fever and activity change.  HENT: Negative for congestion, ear pain, rhinorrhea and sneezing.   Eyes: Negative for discharge.  Respiratory: Positive for cough, shortness of breath and wheezing. Negative for hemoptysis and sputum production.   Cardiovascular: Positive for chest pain and dyspnea on exertion.       Objective:   Physical Exam  Constitutional: She appears well-developed.  HENT:  Head: Normocephalic.  Nose: Nose normal.  Mouth/Throat: Oropharynx is clear and moist. No oropharyngeal exudate.  Neck: Neck supple.  Cardiovascular: Normal rate and normal heart sounds.   No murmur  heard. Pulmonary/Chest: She is in respiratory distress. She has wheezes.  Lymphadenopathy:    She has no cervical adenopathy.  Skin: Skin is warm and dry.  Nursing note and vitals reviewed.         Assessment & Plan:  Severe asthma attack-she was given a nebulizer treatment after being evaluated. She was kept here in our office for 20 additional minutes then reevaluated was found that her lungs had improved aeration. She was encouraged to restart her Flovent twice daily proper way to use inhaler was shown in addition to this albuterol on a frequent basis over the next 1-2 days then as needed plus also prednisone taper warning signs regarding the need to go to ER was discussed chest x-ray ordered because of left side pleuritic chest pain

## 2015-03-19 ENCOUNTER — Other Ambulatory Visit: Payer: Self-pay | Admitting: Family Medicine

## 2015-04-11 ENCOUNTER — Telehealth: Payer: Self-pay | Admitting: Family Medicine

## 2015-04-11 NOTE — Telephone Encounter (Signed)
Placard form, please sign then mail to patients home address on the form

## 2015-04-16 NOTE — Telephone Encounter (Signed)
Has this been done?

## 2015-04-17 NOTE — Telephone Encounter (Signed)
yes

## 2015-05-23 ENCOUNTER — Other Ambulatory Visit: Payer: Self-pay | Admitting: Family Medicine

## 2015-08-12 ENCOUNTER — Encounter: Payer: Self-pay | Admitting: Family Medicine

## 2015-08-12 ENCOUNTER — Ambulatory Visit (INDEPENDENT_AMBULATORY_CARE_PROVIDER_SITE_OTHER): Payer: Medicare Other | Admitting: Family Medicine

## 2015-08-12 VITALS — BP 130/70 | Temp 99.3°F | Ht 60.0 in | Wt 162.0 lb

## 2015-08-12 DIAGNOSIS — I878 Other specified disorders of veins: Secondary | ICD-10-CM | POA: Diagnosis not present

## 2015-08-12 DIAGNOSIS — J4541 Moderate persistent asthma with (acute) exacerbation: Secondary | ICD-10-CM

## 2015-08-12 MED ORDER — LEVALBUTEROL TARTRATE 45 MCG/ACT IN AERO: 2.0000 | INHALATION_SPRAY | Freq: Four times a day (QID) | RESPIRATORY_TRACT | Status: DC | PRN

## 2015-08-12 MED ORDER — PREDNISONE 20 MG PO TABS: ORAL_TABLET | ORAL | Status: DC

## 2015-08-12 MED ORDER — LEVOFLOXACIN 500 MG PO TABS: 500.0000 mg | ORAL_TABLET | Freq: Every day | ORAL | Status: DC

## 2015-08-12 NOTE — Progress Notes (Signed)
   Subjective:    Patient ID: Ariel Cook, female    DOB: Sep 08, 1945, 70 y.o.   MRN: [id number]  Cough This is a new problem. The current episode started 1 to 4 weeks ago. The problem has been unchanged. Associated symptoms include ear pain, headaches, a sore throat and wheezing. Treatments tried: inhaler, tylenol. The treatment provided mild relief.   Patient states that she has no other concerns at this time.   Using inhaler  Sig cough and wheezing at times, with dim energy and wheeign   Review of Systems  HENT: Positive for ear pain and sore throat.   Respiratory: Positive for cough and wheezing.   Neurological: Positive for headaches.       Objective:   Physical Exam  alert moderate malaise HET some nasal congestion lungs impressive bilateral wheezes no tachypnea nonspecific no inspiratory crackles heart rare rhythm       Assessment & Plan:   impression rhinosinusitis/bronchitis with exacerbation of asthma plan antibiotics prescribed. Prednisone taper. Albuterol. For wheezing symptom care discussed

## 2015-08-13 ENCOUNTER — Telehealth: Payer: Self-pay | Admitting: *Deleted

## 2015-08-13 NOTE — Telephone Encounter (Signed)
Incoming fax from [employer]. xopenex hfa not covered by insurance. Proair or proair respiclick is approved. Please advise.

## 2015-08-19 ENCOUNTER — Ambulatory Visit (HOSPITAL_COMMUNITY)
Admission: RE | Admit: 2015-08-19 | Discharge: 2015-08-19 | Disposition: A | Discharge status: Home / Self Care | Payer: Medicare Other | Source: Ambulatory Visit | Attending: Family Medicine | Admitting: Family Medicine

## 2015-08-19 ENCOUNTER — Encounter: Payer: Self-pay | Admitting: Family Medicine

## 2015-08-19 ENCOUNTER — Ambulatory Visit (INDEPENDENT_AMBULATORY_CARE_PROVIDER_SITE_OTHER): Payer: Medicare Other | Admitting: Family Medicine

## 2015-08-19 VITALS — BP 130/80 | Temp 98.4°F | Ht 60.0 in | Wt 162.0 lb

## 2015-08-19 DIAGNOSIS — J984 Other disorders of lung: Secondary | ICD-10-CM | POA: Diagnosis not present

## 2015-08-19 DIAGNOSIS — J209 Acute bronchitis, unspecified: Secondary | ICD-10-CM | POA: Diagnosis not present

## 2015-08-19 DIAGNOSIS — R05 Cough: Secondary | ICD-10-CM | POA: Diagnosis present

## 2015-08-19 DIAGNOSIS — J310 Chronic rhinitis: Secondary | ICD-10-CM

## 2015-08-19 DIAGNOSIS — R059 Cough, unspecified: Secondary | ICD-10-CM

## 2015-08-19 DIAGNOSIS — J329 Chronic sinusitis, unspecified: Secondary | ICD-10-CM

## 2015-08-19 MED ORDER — PREDNISONE 20 MG PO TABS: ORAL_TABLET | ORAL | Status: DC

## 2015-08-19 MED ORDER — AMOXICILLIN-POT CLAVULANATE 875-125 MG PO TABS: 1.0000 | ORAL_TABLET | Freq: Two times a day (BID) | ORAL | Status: DC

## 2015-08-19 MED ORDER — CEFTRIAXONE SODIUM 1 G IJ SOLR
500.0000 mg | Freq: Once | INTRAMUSCULAR | Status: AC
  Administered 2015-08-19: 500 mg via INTRAMUSCULAR

## 2015-08-19 MED ORDER — BENZONATATE 100 MG PO CAPS: 100.0000 mg | ORAL_CAPSULE | Freq: Four times a day (QID) | ORAL | Status: DC | PRN

## 2015-08-19 NOTE — Telephone Encounter (Signed)
Seeing in 1.5 hr will address with pt then

## 2015-08-19 NOTE — Progress Notes (Signed)
   Subjective:    Patient ID: Ariel Cook, female    DOB: [birth year], 70 y.o.   MRN: [id number]  Cough This is a new problem. The current episode started 1 to 4 weeks ago. The problem has been unchanged. Associated symptoms include wheezing. Nothing aggravates the symptoms. She has tried oral steroids (inhaler) for the symptoms. The treatment provided mild relief.   Patient states that she has no other concerns at this time.   Bad cough  Using inhaler faithfully Very bad cough and neck is stuf    Review of Systems  Respiratory: Positive for cough and wheezing.     no vomiting no diarrhea no rash    Objective:   Physical Exam   alert moderate malaise HET Mondays congestion pharynx normal deep bronchial cough positive reactive airways no tachypnea heart regular in rhythm.      Assessment & Plan:   impression rhinosinusitis bronchitis with flare of asthma plan antibiotics prescribed. Chest x-ray. Rocephin shot. Albuterol when necessary symptom care discussed WSL

## 2015-08-20 ENCOUNTER — Telehealth: Payer: Self-pay | Admitting: Family Medicine

## 2015-08-20 NOTE — Telephone Encounter (Signed)
Patient called requesting a referral for hearing loss. May we refer?

## 2015-08-20 NOTE — Telephone Encounter (Signed)
Pt states she wants to make her own appt. Talked with brendale. She is able to schedule her own appt without referral. brendale gave pt info to call and schedule her own appt.

## 2015-08-20 NOTE — Telephone Encounter (Signed)
yes

## 2015-08-27 ENCOUNTER — Other Ambulatory Visit: Payer: Self-pay | Admitting: *Deleted

## 2015-08-30 ENCOUNTER — Other Ambulatory Visit: Payer: Self-pay | Admitting: Family Medicine

## 2015-09-16 ENCOUNTER — Encounter (INDEPENDENT_AMBULATORY_CARE_PROVIDER_SITE_OTHER): Payer: Self-pay | Admitting: *Deleted

## 2015-10-07 ENCOUNTER — Other Ambulatory Visit: Payer: Self-pay | Admitting: Family Medicine

## 2015-11-10 ENCOUNTER — Ambulatory Visit (INDEPENDENT_AMBULATORY_CARE_PROVIDER_SITE_OTHER): Payer: Medicare Other | Admitting: Family Medicine

## 2015-11-10 ENCOUNTER — Encounter: Payer: Self-pay | Admitting: Family Medicine

## 2015-11-10 VITALS — BP 124/80 | Temp 97.5°F | Ht 60.0 in | Wt 156.4 lb

## 2015-11-10 DIAGNOSIS — J4520 Mild intermittent asthma, uncomplicated: Secondary | ICD-10-CM | POA: Diagnosis not present

## 2015-11-10 DIAGNOSIS — I878 Other specified disorders of veins: Secondary | ICD-10-CM | POA: Diagnosis not present

## 2015-11-10 DIAGNOSIS — S161XXA Strain of muscle, fascia and tendon at neck level, initial encounter: Secondary | ICD-10-CM

## 2015-11-10 MED ORDER — ETODOLAC 400 MG PO TABS: 400.0000 mg | ORAL_TABLET | Freq: Two times a day (BID) | ORAL | Status: DC

## 2015-11-10 MED ORDER — FLUTICASONE PROPIONATE HFA 110 MCG/ACT IN AERO: 2.0000 | INHALATION_SPRAY | Freq: Two times a day (BID) | RESPIRATORY_TRACT | Status: DC

## 2015-11-10 MED ORDER — CHLORZOXAZONE 500 MG PO TABS: 500.0000 mg | ORAL_TABLET | Freq: Three times a day (TID) | ORAL | Status: DC | PRN

## 2015-11-10 MED ORDER — TRIAMTERENE-HCTZ 37.5-25 MG PO CAPS: 1.0000 | ORAL_CAPSULE | Freq: Every day | ORAL | Status: DC

## 2015-11-10 NOTE — Progress Notes (Signed)
   Subjective:    Patient ID: Ariel Cook, female    DOB: 1946/04/17, 70 y.o.   MRN: [id number] Patient presents with numerous concerns Shoulder Pain  The pain is present in the left shoulder. This is a new problem. The current episode started 1 to 4 weeks ago. The problem occurs intermittently. The problem has been unchanged. The quality of the pain is described as aching. The pain is moderate. The symptoms are aggravated by activity. She has tried acetaminophen and NSAIDS for the symptoms. The treatment provided no relief.   Patient needs refill on triamterene-hctz and flovent.   Woke up this morning and feeling really bad and painfu;  Bro in law in [place] and car broke down  tood tyle and advil and did not help hardly at all  Lat neck into shoulder   duretic takes reg  flowvent uses reg, just occas use of albuterol. Faithful with the Flovent. Overall has helped chronic asthma a lot. Tries not to miss a dose. No obvious side effects.  Takes diuretic reg, ankles swell without it . Recently ran out of medicine. Now more swollen. Trying to watch salt intake. No obvious side effects with medicine  Trouble laying down   Review of Systems No headache, no major weight loss or weight gain, no chest pain no back pain abdominal pain no change in bowel habits complete ROS otherwise negative     Objective:   Physical Exam  Alert moderate malaise vitals stable HEENT left lateral neck pain. Palpation positive pain with rotation positive supraclavicular tenderness shoulder good range of motion. Lungs no wheezes heart regular rate and rhythm ankles 1+ edema pulses good sensation intact      Assessment & Plan:  Impression cervical strain discussed no need for imaging at this time #2 venous stasis ongoing need for meds discussed and clarified #3 asthma clinically stable on the Flovent needs refill discussed plan diet exercise discussed. Medications refilled. Local measures with neck  pain discussed. Anti-inflammatory medicine and muscle spasm medicine prescribed recheck her persists WSL

## 2015-11-12 ENCOUNTER — Other Ambulatory Visit: Payer: Self-pay | Admitting: *Deleted

## 2015-11-12 ENCOUNTER — Telehealth: Payer: Self-pay | Admitting: Family Medicine

## 2015-11-12 MED ORDER — HYDROCODONE-ACETAMINOPHEN 5-325 MG PO TABS: ORAL_TABLET | ORAL | Status: DC

## 2015-11-12 NOTE — Telephone Encounter (Signed)
Discussed with pt. Pt does not want hydrocodone or any pain meds. She states she will give it more time and call back if worse or not getting better. Script written has been shredded.

## 2015-11-12 NOTE — Telephone Encounter (Signed)
Pt was seen 7/10 and is taking 650 mg tabs of tylenol q4h  She is not having any relief from the pain in her neck an shoulder  The meds you prescribed (Lodine, Chlorzoxazone)  that day she states she is taking them but  They are not helping either. She feels she can hardly take this pain Any longer.   Please advise

## 2015-11-12 NOTE — Telephone Encounter (Signed)
Add hydrocod 5/325 thirty one q four to six prn

## 2016-02-06 ENCOUNTER — Other Ambulatory Visit: Payer: Self-pay | Admitting: Family Medicine

## 2016-02-06 DIAGNOSIS — Z1231 Encounter for screening mammogram for malignant neoplasm of breast: Secondary | ICD-10-CM

## 2016-02-11 ENCOUNTER — Ambulatory Visit (INDEPENDENT_AMBULATORY_CARE_PROVIDER_SITE_OTHER): Payer: Medicare Other | Admitting: Family Medicine

## 2016-02-11 ENCOUNTER — Ambulatory Visit (HOSPITAL_COMMUNITY): Payer: Medicare PPO

## 2016-02-11 ENCOUNTER — Encounter: Payer: Self-pay | Admitting: Family Medicine

## 2016-02-11 ENCOUNTER — Ambulatory Visit (HOSPITAL_COMMUNITY)
Admission: RE | Admit: 2016-02-11 | Discharge: 2016-02-11 | Disposition: A | Discharge status: Home / Self Care | Payer: Medicare Other | Source: Ambulatory Visit | Attending: Family Medicine | Admitting: Family Medicine

## 2016-02-11 VITALS — BP 132/88 | Ht 60.0 in | Wt 151.0 lb

## 2016-02-11 DIAGNOSIS — Z1231 Encounter for screening mammogram for malignant neoplasm of breast: Secondary | ICD-10-CM | POA: Insufficient documentation

## 2016-02-11 DIAGNOSIS — G542 Cervical root disorders, not elsewhere classified: Secondary | ICD-10-CM | POA: Diagnosis not present

## 2016-02-11 MED ORDER — CYCLOBENZAPRINE HCL 10 MG PO TABS
10.0000 mg | ORAL_TABLET | Freq: Three times a day (TID) | ORAL | 0 refills | Status: DC | PRN
Start: 2016-02-11 — End: 2016-12-03

## 2016-02-11 MED ORDER — PREDNISONE 20 MG PO TABS: ORAL_TABLET | ORAL | 0 refills | Status: DC

## 2016-02-11 NOTE — Progress Notes (Signed)
   Subjective:    Patient ID: Ariel Cook, female    DOB: 06/01/1945, 70 y.o.   MRN: [id number]  Shoulder Pain   The pain is present in the neck and left shoulder. This is a recurrent problem. Episode onset: a few months. Treatments tried: tylenol, etodolac, chlorzoxazone, hydrocodone,  The treatment provided no relief.   Pt states she cannot take any pain meds. Can only take tylenol and advil.  Patient has history of back pain as well as history of degenerative back disease. Relates pain in the lateral portion of the left side neck radiates into the trapezius does not radiate down the arm no numbness or weakness in the hand denies any injury. She gets these spells a come and go. Is been worse over the past several days. She denies numbness weakness in the arm. Review of Systems    no fevers no cough no vomiting no headache see above Objective:   Physical Exam Significant tenderness on the left trapezius region limited range of motion in regards to turning the head to the left lungs are clear hearts regular  I believe her trapezius pain is probably coming from tension or within the neck     Assessment & Plan:  Trapezius pain Prednisone taper Tylenol when necessary Muscle relaxer as necessary cautioned drowsiness home use only Patient does not one any type of pain medications. If not improving over the next 1-2 weeks I recommend follow-up office visit possibility patient may end up needing MRI of the neck

## 2016-02-19 ENCOUNTER — Ambulatory Visit (INDEPENDENT_AMBULATORY_CARE_PROVIDER_SITE_OTHER): Payer: Medicare Other | Admitting: Nurse Practitioner

## 2016-02-19 ENCOUNTER — Encounter: Payer: Self-pay | Admitting: Nurse Practitioner

## 2016-02-19 VITALS — BP 138/74 | HR 72 | Ht 69.75 in | Wt 154.0 lb

## 2016-02-19 DIAGNOSIS — Z780 Asymptomatic menopausal state: Secondary | ICD-10-CM

## 2016-02-19 DIAGNOSIS — Z0000 Encounter for general adult medical examination without abnormal findings: Secondary | ICD-10-CM

## 2016-02-19 NOTE — Progress Notes (Signed)
Subjective:    Patient ID: Ariel Cook, female    DOB: 1945/08/27, 70 y.o.   MRN: [id number]  HPI AWV- Annual Wellness Visit  The patient was seen for their annual wellness visit. The patient's past medical history, surgical history, and family history were reviewed. Pertinent vaccines were reviewed ( tetanus, pneumonia, shingles, flu) The patient's medication list was reviewed and updated.  The height and weight were entered. The patient's current BMI is: 22.26  Cognitive screening was completed. Outcome of Mini - Cog: pass  Falls within the past 6 months: none  Current tobacco usage: none (All patients who use tobacco were given written and verbal information on quitting)  Recent listing of emergency department/hospitalizations over the past year were reviewed.  current specialist the patient sees on a regular basis: none   Medicare annual wellness visit patient questionnaire was reviewed.  A written screening schedule for the patient for the next 5-10 years was given. Appropriate discussion of followup regarding next visit was discussed.  Presents for her wellness exam. No vaginal bleeding or pelvic pain. No new sexual partners. Regular vision exams. Needs dental exam. Having some depression and anxiety symptoms related to stress. Denies suicidal or homicidal thoughts or ideation. Defers medication at this time.      Review of Systems  Constitutional: Positive for fatigue. Negative for activity change and appetite change.  HENT: Negative for dental problem, ear pain, sinus pressure and sore throat.   Respiratory: Negative for cough, chest tightness, shortness of breath and wheezing.   Cardiovascular: Negative for chest pain.  Gastrointestinal: Negative for abdominal distention, abdominal pain, blood in stool, constipation, diarrhea, nausea and vomiting.  Genitourinary: Negative for difficulty urinating, dysuria, enuresis, frequency, genital sores, pelvic pain, urgency,  vaginal bleeding and vaginal discharge.  Psychiatric/Behavioral: Positive for dysphoric mood. The patient is nervous/anxious.        Objective:   Physical Exam  Constitutional: She is oriented to person, place, and time. She appears well-developed. No distress.  HENT:  Right Ear: External ear normal.  Left Ear: External ear normal.  Mouth/Throat: Oropharynx is clear and moist.  Neck: Normal range of motion. Neck supple. No tracheal deviation present. No thyromegaly present.  Cardiovascular: Normal rate, regular rhythm and normal heart sounds.  Exam reveals no gallop.   No murmur heard. Pulmonary/Chest: Effort normal and breath sounds normal.  Abdominal: Soft. She exhibits no distension. There is no tenderness.  Genitourinary: Vagina normal and uterus normal. No vaginal discharge found.  Genitourinary Comments: External GU: no rashes or lesions. Vagina: no discharge. Cervix normal in appearance. No CMT. Bimanual exam: no tenderness or obvious masses. Rectal exam: no masses; no stool for hemoccult.   Musculoskeletal: She exhibits no edema.  Lymphadenopathy:    She has no cervical adenopathy.  Neurological: She is alert and oriented to person, place, and time.  Skin: Skin is warm and dry. No rash noted.  Psychiatric: She has a normal mood and affect. Her behavior is normal.  Vitals reviewed. Breast exam: no masses; axillae no adenopathy.         Assessment & Plan:  Routine general medical examination at a health care facility - Plan: CBC with Differential/Platelet, Basic metabolic panel, Lipid panel, Hepatic function panel, TSH, DG Bone Density, POC Hemoccult Bld/Stl (3-Cd Home Screen)  Post-menopausal - Plan: DG Bone Density  Recommend daily vitamin D and calcium supplement. Defers medication for depression and anxiety. Defers colonoscopy at this time. Recommend flu vaccine and Pneumovax at local  pharmacy.  Return in about 1 year (around 02/18/2017) for physical.

## 2016-02-21 LAB — BASIC METABOLIC PANEL
BUN/Creatinine Ratio: 17 (ref 12–28)
BUN: 15 mg/dL (ref 8–27)
CALCIUM: 9.5 mg/dL (ref 8.7–10.3)
CHLORIDE: 99 mmol/L (ref 96–106)
CO2: 29 mmol/L (ref 18–29)
CREATININE: 0.9 mg/dL (ref 0.57–1.00)
GFR, EST AFRICAN AMERICAN: 75 mL/min/{1.73_m2} (ref >59)
GFR, EST NON AFRICAN AMERICAN: 65 mL/min/{1.73_m2} (ref >59)
Glucose: 83 mg/dL (ref 65–99)
Potassium: 4.6 mmol/L (ref 3.5–5.2)
Sodium: 143 mmol/L (ref 134–144)

## 2016-02-21 LAB — LIPID PANEL
Chol/HDL Ratio: 2.6 ratio units (ref 0.0–4.4)
Cholesterol, Total: 218 mg/dL — ABNORMAL HIGH (ref 100–199)
HDL: 85 mg/dL (ref >39)
LDL CALC: 120 mg/dL — AB (ref 0–99)
Triglycerides: 66 mg/dL (ref 0–149)
VLDL CHOLESTEROL CAL: 13 mg/dL (ref 5–40)

## 2016-02-21 LAB — CBC WITH DIFFERENTIAL/PLATELET
BASOS: 0 %
Basophils Absolute: 0 10*3/uL (ref 0.0–0.2)
EOS (ABSOLUTE): 0.1 10*3/uL (ref 0.0–0.4)
EOS: 1 %
HEMATOCRIT: 39.8 % (ref 34.0–46.6)
HEMOGLOBIN: 13.5 g/dL (ref 11.1–15.9)
IMMATURE GRANS (ABS): 0.1 10*3/uL (ref 0.0–0.1)
IMMATURE GRANULOCYTES: 1 %
LYMPHS: 51 %
Lymphocytes Absolute: 4.7 10*3/uL — ABNORMAL HIGH (ref 0.7–3.1)
MCH: 31.6 pg (ref 26.6–33.0)
MCHC: 33.9 g/dL (ref 31.5–35.7)
MCV: 93 fL (ref 79–97)
MONOCYTES: 9 %
Monocytes Absolute: 0.8 10*3/uL (ref 0.1–0.9)
NEUTROS ABS: 3.5 10*3/uL (ref 1.4–7.0)
NEUTROS PCT: 38 %
PLATELETS: 310 10*3/uL (ref 150–379)
RBC: 4.27 x10E6/uL (ref 3.77–5.28)
RDW: 13.5 % (ref 12.3–15.4)
WBC: 9.2 10*3/uL (ref 3.4–10.8)

## 2016-02-21 LAB — HEPATIC FUNCTION PANEL
ALK PHOS: 94 IU/L (ref 39–117)
ALT: 20 IU/L (ref 0–32)
AST: 15 IU/L (ref 0–40)
Albumin: 4.2 g/dL (ref 3.6–4.8)
Bilirubin Total: 0.6 mg/dL (ref 0.0–1.2)
Bilirubin, Direct: 0.14 mg/dL (ref 0.00–0.40)
TOTAL PROTEIN: 6.2 g/dL (ref 6.0–8.5)

## 2016-02-21 LAB — TSH: TSH: 2.37 u[IU]/mL (ref 0.450–4.500)

## 2016-03-26 ENCOUNTER — Other Ambulatory Visit: Payer: Self-pay | Admitting: *Deleted

## 2016-03-26 DIAGNOSIS — Z0000 Encounter for general adult medical examination without abnormal findings: Secondary | ICD-10-CM

## 2016-03-26 LAB — POC HEMOCCULT BLD/STL (HOME/3-CARD/SCREEN)
Card #2 Fecal Occult Blod, POC: NEGATIVE
Card #3 Fecal Occult Blood, POC: NEGATIVE
Fecal Occult Blood, POC: NEGATIVE

## 2016-04-19 ENCOUNTER — Encounter: Payer: Self-pay | Admitting: Family Medicine

## 2016-04-19 ENCOUNTER — Ambulatory Visit (INDEPENDENT_AMBULATORY_CARE_PROVIDER_SITE_OTHER): Payer: Medicare Other | Admitting: Family Medicine

## 2016-04-19 VITALS — BP 114/72 | Temp 98.1°F | Ht 59.75 in | Wt 152.0 lb

## 2016-04-19 DIAGNOSIS — Z23 Encounter for immunization: Secondary | ICD-10-CM | POA: Diagnosis not present

## 2016-04-19 DIAGNOSIS — H1032 Unspecified acute conjunctivitis, left eye: Secondary | ICD-10-CM

## 2016-04-19 DIAGNOSIS — J310 Chronic rhinitis: Secondary | ICD-10-CM

## 2016-04-19 DIAGNOSIS — J329 Chronic sinusitis, unspecified: Secondary | ICD-10-CM | POA: Diagnosis not present

## 2016-04-19 MED ORDER — GENTAMICIN SULFATE 0.3 % OP SOLN: 2.0000 [drp] | Freq: Four times a day (QID) | OPHTHALMIC | 0 refills | Status: AC

## 2016-04-19 MED ORDER — AMOXICILLIN-POT CLAVULANATE 875-125 MG PO TABS: 1.0000 | ORAL_TABLET | Freq: Two times a day (BID) | ORAL | 0 refills | Status: DC

## 2016-04-19 NOTE — Progress Notes (Signed)
   Subjective:    Patient ID: Ariel Cook, female    DOB: 02-22-1946, 70 y.o.   MRN: [id number]  HPIleft eye swelling and drainage, headache. Started 2 weeks ago. Using warm compresses.   Eye irrit and red  Bloodshot low4r eyelid  Pt tried to rupture it  Developed secretions  Now has moved to upper eyelid  Tender and sore  pso sig dranage  Got eyelid lbue and inflammed Overall asthma has been stable some frontal headache and congestion Review of Systems No headache, no major weight loss or weight gain, no chest pain no back pain abdominal pain no change in bowel habits complete ROS otherwise negative     Objective:   Physical Exam Alert vitals stable left eye upper eyelid crusty tender. Plus minus frontal tenderness pharynx normal lungs currently clear heart regular in rhythm       Assessment & Plan:  Impression left eye conjunctivitis with sinusitis plan antibiotics prescribed. Eyedrops prescribed. Symptom care discussed. Flu shot at patient request

## 2016-06-21 ENCOUNTER — Ambulatory Visit (INDEPENDENT_AMBULATORY_CARE_PROVIDER_SITE_OTHER): Payer: Medicare Other | Admitting: Family Medicine

## 2016-06-21 ENCOUNTER — Encounter: Payer: Self-pay | Admitting: Family Medicine

## 2016-06-21 VITALS — BP 120/72 | Temp 97.8°F | Ht 59.75 in | Wt 154.0 lb

## 2016-06-21 DIAGNOSIS — J209 Acute bronchitis, unspecified: Secondary | ICD-10-CM | POA: Diagnosis not present

## 2016-06-21 MED ORDER — LEVOFLOXACIN 500 MG PO TABS: 500.0000 mg | ORAL_TABLET | Freq: Every day | ORAL | 0 refills | Status: AC

## 2016-06-21 NOTE — Progress Notes (Signed)
   Subjective:    Patient ID: Ariel Cook, female    DOB: 02/21/1946, 71 y.o.   MRN: [id number]  Cough  This is a new problem. The current episode started 1 to 4 weeks ago. The problem has been unchanged. The cough is productive of sputum. Associated symptoms include headaches and a sore throat. Nothing aggravates the symptoms. Treatments tried: tylenol. The treatment provided no relief.   Pt got sick nearly ten d ago  Bad cough and cong and wheezing  Deep cough   Pos prod yellw phlegm, loosened up some  Not using inhaler much, just serious heafache and cong and cough   Review of Systems  HENT: Positive for sore throat.   Respiratory: Positive for cough.   Neurological: Positive for headaches.       Objective:   Physical Exam Alert active good hydration HEENT moderate nasal congestion pharynx normal lungs bilateral wheezes heart regular rhythm       Assessment & Plan:  Impression post viral bronchitis with exacerbation of asthma plan Levaquin daily 10 days symptom care discussed maintain inhaler

## 2016-07-23 ENCOUNTER — Encounter (INDEPENDENT_AMBULATORY_CARE_PROVIDER_SITE_OTHER): Payer: Medicare Other | Admitting: Ophthalmology

## 2016-07-23 DIAGNOSIS — D3131 Benign neoplasm of right choroid: Secondary | ICD-10-CM

## 2016-07-23 DIAGNOSIS — H353122 Nonexudative age-related macular degeneration, left eye, intermediate dry stage: Secondary | ICD-10-CM | POA: Diagnosis not present

## 2016-07-23 DIAGNOSIS — H43813 Vitreous degeneration, bilateral: Secondary | ICD-10-CM | POA: Diagnosis not present

## 2016-07-23 DIAGNOSIS — H353111 Nonexudative age-related macular degeneration, right eye, early dry stage: Secondary | ICD-10-CM | POA: Diagnosis not present

## 2016-07-28 ENCOUNTER — Ambulatory Visit (INDEPENDENT_AMBULATORY_CARE_PROVIDER_SITE_OTHER): Payer: Medicare Other | Admitting: Family Medicine

## 2016-07-28 ENCOUNTER — Encounter: Payer: Self-pay | Admitting: Family Medicine

## 2016-07-28 VITALS — BP 130/70 | Temp 98.3°F | Ht 59.75 in | Wt 149.0 lb

## 2016-07-28 DIAGNOSIS — M1712 Unilateral primary osteoarthritis, left knee: Secondary | ICD-10-CM

## 2016-07-28 NOTE — Progress Notes (Signed)
   Subjective:    Patient ID: Ariel Cook, female    DOB: [birth year], 71 y.o.   MRN: [id number]  Shoulder Pain   The pain is present in the left shoulder. This is a recurrent problem. The current episode started more than 1 month ago.  Knee Pain   The incident occurred more than 1 week ago. The pain is present in the left knee. The symptoms are aggravated by weight bearing.   Left knee stiffens up and gets deep ache in it  Shoulder I also popping and cracking and ching and pain radiates to left arm  Left eye seeing macular degenertion, with supplement trying to help  Pos family hx of  Bone cancer, pt worried about this   Takes advil and tylenol rarely, usually tyl 65o helps with the arthitis pain  Knee and shoulder both hurts but knee is the worst  Knee worsening over Has concerns of whole left sided body pain.    Review of Systems No headache, no major weight loss or weight gain, no chest pain no back pain abdominal pain no change in bowel habits complete ROS otherwise negative     Objective:   Physical Exam  Alert vitals stable, NAD. Blood pressure good on repeat. HEENT normal. Lungs clear. Heart regular rate and rhythm. Left shoulder reveals substantial impingement signs both knees reveal crepitations with apparent effusion on the left side and some posterior knee tenderness no obvious palpable Baker's cyst Thank you      Assessment & Plan:  Dr Lynann Bologna or partnerImpression #1 impingement left shoulder discussed worsening #2 progressive knee arthritis discuss left greater than right plan referral to orthopedist. Patient already taking anti-inflammatory medication. Symptom care discussed Tylenol when necessary

## 2016-12-03 ENCOUNTER — Emergency Department (HOSPITAL_COMMUNITY)
Admission: EM | Admit: 2016-12-03 | Discharge: 2016-12-03 | Disposition: A | Discharge status: Home / Self Care | Payer: Medicare Other | Attending: Emergency Medicine | Admitting: Emergency Medicine

## 2016-12-03 ENCOUNTER — Encounter (HOSPITAL_COMMUNITY): Payer: Self-pay | Admitting: Emergency Medicine

## 2016-12-03 DIAGNOSIS — Z79899 Other long term (current) drug therapy: Secondary | ICD-10-CM | POA: Diagnosis not present

## 2016-12-03 DIAGNOSIS — J45909 Unspecified asthma, uncomplicated: Secondary | ICD-10-CM | POA: Insufficient documentation

## 2016-12-03 DIAGNOSIS — R42 Dizziness and giddiness: Secondary | ICD-10-CM

## 2016-12-03 DIAGNOSIS — Z791 Long term (current) use of non-steroidal anti-inflammatories (NSAID): Secondary | ICD-10-CM | POA: Insufficient documentation

## 2016-12-03 LAB — CBC WITH DIFFERENTIAL/PLATELET
Basophils Absolute: 0 10*3/uL (ref 0.0–0.1)
Basophils Relative: 0 %
EOS ABS: 0 10*3/uL (ref 0.0–0.7)
Eosinophils Relative: 0 %
HCT: 41.3 % (ref 36.0–46.0)
Hemoglobin: 13.8 g/dL (ref 12.0–15.0)
LYMPHS ABS: 2 10*3/uL (ref 0.7–4.0)
LYMPHS PCT: 25 %
MCH: 31.4 pg (ref 26.0–34.0)
MCHC: 33.4 g/dL (ref 30.0–36.0)
MCV: 94.1 fL (ref 78.0–100.0)
MONO ABS: 0.5 10*3/uL (ref 0.1–1.0)
Monocytes Relative: 6 %
Neutro Abs: 5.6 10*3/uL (ref 1.7–7.7)
Neutrophils Relative %: 69 %
PLATELETS: 277 10*3/uL (ref 150–400)
RBC: 4.39 MIL/uL (ref 3.87–5.11)
RDW: 12.3 % (ref 11.5–15.5)
WBC: 8.1 10*3/uL (ref 4.0–10.5)

## 2016-12-03 LAB — COMPREHENSIVE METABOLIC PANEL
ALT: 18 U/L (ref 14–54)
ANION GAP: 9 (ref 5–15)
AST: 25 U/L (ref 15–41)
Albumin: 4.6 g/dL (ref 3.5–5.0)
Alkaline Phosphatase: 94 U/L (ref 38–126)
BUN: 14 mg/dL (ref 6–20)
CHLORIDE: 103 mmol/L (ref 101–111)
CO2: 28 mmol/L (ref 22–32)
CREATININE: 0.9 mg/dL (ref 0.44–1.00)
Calcium: 9.6 mg/dL (ref 8.9–10.3)
Glucose, Bld: 137 mg/dL — ABNORMAL HIGH (ref 65–99)
POTASSIUM: 3.7 mmol/L (ref 3.5–5.1)
SODIUM: 140 mmol/L (ref 135–145)
Total Bilirubin: 0.6 mg/dL (ref 0.3–1.2)
Total Protein: 7.7 g/dL (ref 6.5–8.1)

## 2016-12-03 LAB — URINALYSIS, ROUTINE W REFLEX MICROSCOPIC
BILIRUBIN URINE: NEGATIVE
Glucose, UA: NEGATIVE mg/dL
HGB URINE DIPSTICK: NEGATIVE
KETONES UR: NEGATIVE mg/dL
NITRITE: NEGATIVE
Protein, ur: NEGATIVE mg/dL
SPECIFIC GRAVITY, URINE: 1.005 (ref 1.005–1.030)
pH: 7 (ref 5.0–8.0)

## 2016-12-03 MED ORDER — MECLIZINE HCL 12.5 MG PO TABS
25.0000 mg | ORAL_TABLET | Freq: Once | ORAL | Status: AC
  Administered 2016-12-03: 25 mg via ORAL
  Filled 2016-12-03: qty 2

## 2016-12-03 MED ORDER — SODIUM CHLORIDE 0.9 % IV BOLUS (SEPSIS)
1000.0000 mL | Freq: Once | INTRAVENOUS | Status: AC
  Administered 2016-12-03: 1000 mL via INTRAVENOUS

## 2016-12-03 MED ORDER — MECLIZINE HCL 25 MG PO TABS: 12.5000 mg | ORAL_TABLET | Freq: Three times a day (TID) | ORAL | 0 refills | Status: DC | PRN

## 2016-12-03 NOTE — ED Triage Notes (Signed)
Complains of Dizziness  Dr Wolfgang Phoenix PCP

## 2016-12-03 NOTE — ED Provider Notes (Signed)
[name] DEPT Provider Note   CSN: [id number] Arrival date & time: 12/03/16  1826     History   Chief Complaint Chief Complaint  Patient presents with  . Dizziness    HPI Ariel Cook is a 71 y.o. female.   Dizziness  Quality:  Head spinning and vertigo Severity:  Moderate Onset quality:  Gradual Duration:  2 hours Timing:  Constant Progression:  Improving Chronicity:  New Relieved by:  None tried Worsened by:  Nothing Ineffective treatments:  None tried   Past Medical History:  Diagnosis Date  . Asthma    USES ALBUTEROL INHALER  . DDD (degenerative disc disease), cervical   . Headache(784.0)   . Left carpal tunnel syndrome 09/03/2011  . PONV (postoperative nausea and vomiting)   . Sciatica   . Unspecified adverse effect of anesthesia    CAUSES ASTHMA TO EXACERBATE, CAUSES MIGRAINES    Patient Active Problem List   Diagnosis Date Noted  . Asthma with exacerbation 01/09/2015  . Osteoarthritis cervical spine 03/10/2014  . Chronic lower back pain 02/12/2014  . Hereditary and idiopathic peripheral neuropathy 02/12/2014  . Venous stasis 03/26/2013  . Chest pain 03/26/2013  . Left carpal tunnel syndrome 09/03/2011    Past Surgical History:  Procedure Laterality Date  . APPENDECTOMY  2010  . CARPAL TUNNEL RELEASE  2010   RT  . CARPAL TUNNEL RELEASE  09/03/2011   Procedure: CARPAL TUNNEL RELEASE;  Surgeon: Johnny Bridge, MD;  Location: [facility];  Service: Orthopedics;  Laterality: Left;  . EYE SURGERY  2011   BIL CATARACT  . TUBAL LIGATION  1976    OB History    No data available       Home Medications    Prior to Admission medications   Medication Sig Start Date End Date Taking? Authorizing Provider  acetaminophen (TYLENOL) 650 MG CR tablet Take 650 mg by mouth every 8 (eight) hours as needed for pain.   Yes [provider]  albuterol (PROVENTIL HFA;VENTOLIN HFA) 108 (90 BASE) MCG/ACT inhaler Inhale 2 puffs into  the lungs every 6 (six) hours as needed for wheezing or shortness of breath. 01/09/15  Yes Kathyrn Drown, MD  Biotin 10000 MCG TABS Take 1 tablet by mouth daily.    Yes [provider]  fluticasone (FLOVENT HFA) 110 MCG/ACT inhaler Inhale 2 puffs into the lungs 2 (two) times daily. Patient taking differently: Inhale 2 puffs into the lungs daily as needed (SHORTNESS OF BREATH/WHEEZING).  11/10/15  Yes Mikey Kirschner, MD  meloxicam (MOBIC) 15 MG tablet Take 15 mg by mouth daily.   Yes [provider]  Misc Natural Products (OSTEO BI-FLEX JOINT SHIELD PO) Take 1 tablet by mouth daily.    Yes [provider]  Multiple Vitamins-Minerals (ICAPS AREDS 2 PO) Take 2 tablets by mouth daily.    Yes [provider]  predniSONE (DELTASONE) 5 MG tablet Take 5 mg by mouth See admin instructions. Take as directed (6,5,4,3,2,1) starting on 11/30/2016   Yes [provider]  meclizine (ANTIVERT) 25 MG tablet Take 0.5 tablets (12.5 mg total) by mouth 3 (three) times daily as needed for dizziness. 12/03/16   Shireen Rayburn, Corene Cornea, MD    Family History Family History  Problem Relation Age of Onset  . Hypertension Mother   . Heart attack Father   . Breast cancer Sister     Social History Social History  Substance Use Topics  . Smoking status: Never Smoker  .  Smokeless tobacco: Never Used  . Alcohol use No     Allergies   Sulfa antibiotics; Other; and Stadol [butorphanol]   Review of Systems Review of Systems  Neurological: Positive for dizziness.  All other systems reviewed and are negative.    Physical Exam Updated Vital Signs BP 120/62   Pulse 63   Temp (!) 97.5 F (36.4 C)   Resp 12   Ht 5\' 2"  (1.575 m)   Wt 67.6 kg (149 lb)   SpO2 99%   BMI 27.25 kg/m   Physical Exam  Constitutional: She is oriented to person, place, and time. She appears well-developed and well-nourished.  HENT:  Head: Normocephalic and atraumatic.  Eyes: Conjunctivae and EOM  are normal.  Neck: Normal range of motion.  Cardiovascular: Normal rate and regular rhythm.   Pulmonary/Chest: No stridor. No respiratory distress.  Abdominal: She exhibits no distension.  Neurological: She is alert and oriented to person, place, and time. No cranial nerve deficit. Coordination normal.  No altered mental status, able to give full seemingly accurate history.  Face is symmetric, EOM's intact, pupils equal and reactive, vision intact, tongue and uvula midline without deviation. Upper and Lower extremity motor 5/5, intact pain perception in distal extremities, 2+ reflexes in biceps, patella and achilles tendons. Able to perform finger to nose normal with both hands. Walks without assistance or evident ataxia.    Nursing note and vitals reviewed.    ED Treatments / Results  Labs (all labs ordered are listed, but only abnormal results are displayed) Labs Reviewed  COMPREHENSIVE METABOLIC PANEL - Abnormal; Notable for the following:       Result Value   Glucose, Bld 137 (*)    All other components within normal limits  URINALYSIS, ROUTINE W REFLEX MICROSCOPIC - Abnormal; Notable for the following:    Color, Urine STRAW (*)    Leukocytes, UA SMALL (*)    Bacteria, UA RARE (*)    Squamous Epithelial / LPF 0-5 (*)    All other components within normal limits  URINE CULTURE  CBC WITH DIFFERENTIAL/PLATELET    EKG  EKG Interpretation None       Radiology No results found.  Procedures Procedures (including critical care time)  Medications Ordered in ED Medications  meclizine (ANTIVERT) tablet 25 mg (25 mg Oral Given 12/03/16 1906)  sodium chloride 0.9 % bolus 1,000 mL (0 mLs Intravenous Stopped 12/03/16 2017)     Initial Impression / Assessment and Plan / ED Course  I have reviewed the triage vital signs and the nursing notes.  Pertinent labs & imaging results that were available during my care of the patient were reviewed by me and considered in my medical  decision making (see chart for details).     Vertigo similar to previous. Likely peripheral, doubt central. Significantly improved with medications here. Stable for dc on same.   Final Clinical Impressions(s) / ED Diagnoses   Final diagnoses:  Vertigo    New Prescriptions Discharge Medication List as of 12/03/2016  8:45 PM    START taking these medications   Details  meclizine (ANTIVERT) 25 MG tablet Take 0.5 tablets (12.5 mg total) by mouth 3 (three) times daily as needed for dizziness., Starting Fri 12/03/2016, Print         Lynnsie Linders, Corene Cornea, MD 12/03/16 2158

## 2016-12-03 NOTE — ED Triage Notes (Signed)
Patient complaining of dizziness that started at 1700 today. States she has a history of vertigo. Also complaining of nausea.

## 2016-12-06 LAB — URINE CULTURE

## 2016-12-09 ENCOUNTER — Encounter (INDEPENDENT_AMBULATORY_CARE_PROVIDER_SITE_OTHER): Payer: Medicare Other | Admitting: Ophthalmology

## 2016-12-09 DIAGNOSIS — H43813 Vitreous degeneration, bilateral: Secondary | ICD-10-CM

## 2016-12-09 DIAGNOSIS — H353111 Nonexudative age-related macular degeneration, right eye, early dry stage: Secondary | ICD-10-CM | POA: Diagnosis not present

## 2016-12-09 DIAGNOSIS — H353122 Nonexudative age-related macular degeneration, left eye, intermediate dry stage: Secondary | ICD-10-CM

## 2016-12-09 DIAGNOSIS — D3132 Benign neoplasm of left choroid: Secondary | ICD-10-CM

## 2017-02-03 ENCOUNTER — Other Ambulatory Visit: Payer: Self-pay | Admitting: Family Medicine

## 2017-02-03 ENCOUNTER — Telehealth: Payer: Self-pay | Admitting: Family Medicine

## 2017-02-03 DIAGNOSIS — E785 Hyperlipidemia, unspecified: Secondary | ICD-10-CM

## 2017-02-03 DIAGNOSIS — Z1231 Encounter for screening mammogram for malignant neoplasm of breast: Secondary | ICD-10-CM

## 2017-02-03 DIAGNOSIS — R739 Hyperglycemia, unspecified: Secondary | ICD-10-CM

## 2017-02-03 DIAGNOSIS — Z79899 Other long term (current) drug therapy: Secondary | ICD-10-CM

## 2017-02-03 NOTE — Telephone Encounter (Signed)
Patient has appointment for 10/24 for physical and needing labs done.She would like to be checked for diabetes has a family history of this.

## 2017-02-04 ENCOUNTER — Ambulatory Visit: Payer: Medicare Other | Admitting: Family Medicine

## 2017-02-04 NOTE — Telephone Encounter (Signed)
I called and left detailed message that we have sent the order to [facility], she may have labs drawn any time prior to appt . If any questions or concerns please call our office.

## 2017-02-04 NOTE — Telephone Encounter (Signed)
Lipid, liver, metabolic 7, hemoglobin [id number] fasting hyperglycemia screening

## 2017-02-07 ENCOUNTER — Encounter: Payer: Self-pay | Admitting: Family Medicine

## 2017-02-07 ENCOUNTER — Ambulatory Visit (INDEPENDENT_AMBULATORY_CARE_PROVIDER_SITE_OTHER): Payer: Medicare Other | Admitting: Family Medicine

## 2017-02-07 VITALS — BP 128/74 | Temp 98.4°F | Ht 59.5 in | Wt 151.0 lb

## 2017-02-07 DIAGNOSIS — L2389 Allergic contact dermatitis due to other agents: Secondary | ICD-10-CM

## 2017-02-07 MED ORDER — METHYLPREDNISOLONE ACETATE 40 MG/ML IJ SUSP
40.0000 mg | Freq: Once | INTRAMUSCULAR | Status: AC
  Administered 2017-02-07: 40 mg via INTRAMUSCULAR

## 2017-02-07 MED ORDER — PREDNISONE 20 MG PO TABS: ORAL_TABLET | ORAL | 0 refills | Status: DC

## 2017-02-07 NOTE — Progress Notes (Signed)
   Subjective:    Patient ID: Ariel Cook, female    DOB: Mar 01, 1946, 70 y.o.   MRN: [id number]  Rash  This is a new problem. Episode onset: 4 days. Location: both arms, chest. The rash is characterized by itchiness, redness, blistering, swelling and burning. Treatments tried: benadryl, calamine lotion.   Patient with a large amount of swelling on her left arm with redness excoriated areas blistered areas consistent with secondary reaction to contact dermatitis from poison ivy she states she was moving a tree and some brush that fell across to her driveway she denied having this trouble before   Review of Systems  Skin: Positive for rash.  No other findings no fevers chills sweats     Objective:   Physical Exam Lungs clear heart regular extensive amount of contact dermatitis on the left arm also some on the right hand and some on the chest       Assessment & Plan:  Contact dermatitis Depo-Medrol Prednisone taper No sign of bacterial component Should gradually be getting better if not please notify us

## 2017-02-17 ENCOUNTER — Ambulatory Visit (HOSPITAL_COMMUNITY)
Admission: RE | Admit: 2017-02-17 | Discharge: 2017-02-17 | Disposition: A | Discharge status: Home / Self Care | Payer: Medicare Other | Source: Ambulatory Visit | Attending: Family Medicine | Admitting: Family Medicine

## 2017-02-17 DIAGNOSIS — Z1231 Encounter for screening mammogram for malignant neoplasm of breast: Secondary | ICD-10-CM | POA: Diagnosis not present

## 2017-02-24 ENCOUNTER — Encounter: Payer: Self-pay | Admitting: Family Medicine

## 2017-02-24 ENCOUNTER — Ambulatory Visit (INDEPENDENT_AMBULATORY_CARE_PROVIDER_SITE_OTHER): Payer: Medicare Other | Admitting: Family Medicine

## 2017-02-24 VITALS — Ht 59.5 in | Wt 149.0 lb

## 2017-02-24 DIAGNOSIS — Z1211 Encounter for screening for malignant neoplasm of colon: Secondary | ICD-10-CM | POA: Diagnosis not present

## 2017-02-24 DIAGNOSIS — M1712 Unilateral primary osteoarthritis, left knee: Secondary | ICD-10-CM

## 2017-02-24 DIAGNOSIS — J4541 Moderate persistent asthma with (acute) exacerbation: Secondary | ICD-10-CM | POA: Diagnosis not present

## 2017-02-24 DIAGNOSIS — Z23 Encounter for immunization: Secondary | ICD-10-CM | POA: Diagnosis not present

## 2017-02-24 DIAGNOSIS — F339 Major depressive disorder, recurrent, unspecified: Secondary | ICD-10-CM

## 2017-02-24 DIAGNOSIS — Z0000 Encounter for general adult medical examination without abnormal findings: Secondary | ICD-10-CM

## 2017-02-24 DIAGNOSIS — M5440 Lumbago with sciatica, unspecified side: Secondary | ICD-10-CM | POA: Diagnosis not present

## 2017-02-24 DIAGNOSIS — R21 Rash and other nonspecific skin eruption: Secondary | ICD-10-CM | POA: Diagnosis not present

## 2017-02-24 DIAGNOSIS — Z0001 Encounter for general adult medical examination with abnormal findings: Secondary | ICD-10-CM | POA: Diagnosis not present

## 2017-02-24 DIAGNOSIS — G8929 Other chronic pain: Secondary | ICD-10-CM | POA: Diagnosis not present

## 2017-02-24 MED ORDER — FLUTICASONE PROPIONATE HFA 110 MCG/ACT IN AERO: 2.0000 | INHALATION_SPRAY | Freq: Two times a day (BID) | RESPIRATORY_TRACT | 5 refills | Status: DC

## 2017-02-24 MED ORDER — TRIAMCINOLONE ACETONIDE 0.1 % EX CREA: 1.0000 "application " | TOPICAL_CREAM | Freq: Two times a day (BID) | CUTANEOUS | 0 refills | Status: DC

## 2017-02-24 MED ORDER — ALBUTEROL SULFATE HFA 108 (90 BASE) MCG/ACT IN AERS: 2.0000 | INHALATION_SPRAY | Freq: Four times a day (QID) | RESPIRATORY_TRACT | 5 refills | Status: DC | PRN

## 2017-02-24 NOTE — Progress Notes (Signed)
Subjective:  Cook arrives for wellness plus Ariel Cook    D: JUDITHE KEETCH, female    DOB: 1945/06/13, 71 y.o.   MRN: [id number]  HPI AWV- Annual Wellness Visit  The Cook was seen for their annual wellness visit. The Cook's past medical history, surgical history, and family history were reviewed. Pertinent vaccines were reviewed ( tetanus, pneumonia, shingles, flu) The Cook's medication list was reviewed and updated.  The height and weight were entered. The Cook's current BMI is: 29.59  Cognitive screening was completed. Outcome of Mini - Cog: pass  Falls within the past 6 months: none  Current tobacco usage: none (All patients who use tobacco were given written and verbal information on quitting)  Recent listing of emergency department/hospitalizations over the past year were reviewed.  current specialist the Cook sees on a regular basis:    Medicare annual wellness visit Cook questionnaire was reviewed.  A written screening schedule for the Cook for the next 5-10 years was given. Appropriate discussion of followup regarding next visit was discussed.  Declines flu vaccine. She wants to get senior flu vaccine at the pharm.    Using rescue inhaler just a couple times  As needed  Some wheezing, had serious rxn rash . Overall beter with recetn pre taper but still very aggravating.  Cook would like something topical if possible.  Chronic asthma.  Ongoing.  Fortunately using steroid inhaler regularly.  Notes substantial improvement.  Ongoing challenges with depression.  Cook not suicidal.  See PHQ 9.  Adamant about no antidepressants at this time.   Working with ortho doc, has chronic dgeern changes in spine and shoulders and knees    Review of Systems  Constitutional: Negative for activity change, appetite change and fatigue.  HENT: Negative for congestion and rhinorrhea.   Eyes: Negative for discharge.  Respiratory: Negative for cough,  chest tightness and wheezing.   Cardiovascular: Negative for chest pain.  Gastrointestinal: Negative for abdominal pain, blood in stool and vomiting.  Endocrine: Negative for polyphagia.  Genitourinary: Negative for difficulty urinating and frequency.  Musculoskeletal: Negative for neck pain.  Skin: Negative for color change.  Allergic/Immunologic: Negative for environmental allergies and food allergies.  Neurological: Negative for weakness and headaches.  Psychiatric/Behavioral: Negative for agitation and behavioral problems.  All other systems reviewed and are negative.      Objective:   Physical Exam  Constitutional: She is oriented to person, place, and time. She appears well-developed and well-nourished.  HENT:  Head: Normocephalic and atraumatic.  Right Ear: External ear normal.  Left Ear: External ear normal.  Eyes: Right eye exhibits no discharge. Left eye exhibits no discharge.  Neck: Normal range of motion. No tracheal deviation present.  Cardiovascular: Normal rate, regular rhythm, normal heart sounds and intact distal pulses.  Exam reveals no gallop.   No murmur heard. Pulmonary/Chest: Effort normal and breath sounds normal. No stridor. No respiratory distress. She has no wheezes. She has no rales.  Abdominal: Soft. Bowel sounds are normal. She exhibits no distension and no mass. There is no tenderness. There is no rebound and no guarding.  Genitourinary:  Genitourinary Comments: Pelvic exam within normal limits  Musculoskeletal: Normal range of motion. She exhibits no edema or tenderness.  Lymphadenopathy:    She has no cervical adenopathy.  Neurological: She is alert and oriented to person, place, and time. She exhibits normal muscle tone.  Skin: Skin is warm and dry.  Psychiatric: She has a normal mood and affect. Her  behavior is normal.   Skin substantial rash left arm though considerably improved per Cook       Assessment & Plan:  impression #1 wellness  exam vaccines discussed.  Cook agrees to flu shot today.  Now willing to do colonoscopy fortunately.  Up-to-date on mammogram.  2.  Asthma good control at home is maintained steroid inhaler will maintain  3.  Rash left arm residual would like more medicine will add cream  4..Depression ongoing.  Cook wishes no medications.  Exercise discussed in this regard  Medications refilled.  Flu shot.  Colonoscopy scheduled

## 2017-02-25 LAB — LIPID PANEL
CHOL/HDL RATIO: 2.7 ratio (ref 0.0–4.4)
Cholesterol, Total: 237 mg/dL — ABNORMAL HIGH (ref 100–199)
HDL: 88 mg/dL (ref >39)
LDL Calculated: 136 mg/dL — ABNORMAL HIGH (ref 0–99)
Triglycerides: 65 mg/dL (ref 0–149)
VLDL Cholesterol Cal: 13 mg/dL (ref 5–40)

## 2017-02-25 LAB — HEPATIC FUNCTION PANEL
ALBUMIN: 4.3 g/dL (ref 3.5–4.8)
ALT: 15 IU/L (ref 0–32)
AST: 14 IU/L (ref 0–40)
Alkaline Phosphatase: 110 IU/L (ref 39–117)
BILIRUBIN, DIRECT: 0.12 mg/dL (ref 0.00–0.40)
Bilirubin Total: 0.5 mg/dL (ref 0.0–1.2)
TOTAL PROTEIN: 6.7 g/dL (ref 6.0–8.5)

## 2017-02-25 LAB — BASIC METABOLIC PANEL
BUN / CREAT RATIO: 14 (ref 12–28)
BUN: 11 mg/dL (ref 8–27)
CO2: 29 mmol/L (ref 20–29)
CREATININE: 0.79 mg/dL (ref 0.57–1.00)
Calcium: 9.5 mg/dL (ref 8.7–10.3)
Chloride: 102 mmol/L (ref 96–106)
GFR calc Af Amer: 88 mL/min/{1.73_m2} (ref >59)
GFR calc non Af Amer: 76 mL/min/{1.73_m2} (ref >59)
GLUCOSE: 89 mg/dL (ref 65–99)
Potassium: 4.6 mmol/L (ref 3.5–5.2)
Sodium: 142 mmol/L (ref 134–144)

## 2017-02-25 LAB — HEMOGLOBIN A1C
ESTIMATED AVERAGE GLUCOSE: 120 mg/dL
HEMOGLOBIN A1C: 5.8 % — AB (ref 4.8–5.6)

## 2017-03-02 ENCOUNTER — Encounter (INDEPENDENT_AMBULATORY_CARE_PROVIDER_SITE_OTHER): Payer: Self-pay | Admitting: *Deleted

## 2017-03-02 ENCOUNTER — Encounter: Payer: Self-pay | Admitting: Family Medicine

## 2017-03-14 ENCOUNTER — Encounter (INDEPENDENT_AMBULATORY_CARE_PROVIDER_SITE_OTHER): Payer: Medicare Other | Admitting: Ophthalmology

## 2017-03-14 DIAGNOSIS — H43813 Vitreous degeneration, bilateral: Secondary | ICD-10-CM

## 2017-03-14 DIAGNOSIS — D3132 Benign neoplasm of left choroid: Secondary | ICD-10-CM | POA: Diagnosis not present

## 2017-03-14 DIAGNOSIS — H353122 Nonexudative age-related macular degeneration, left eye, intermediate dry stage: Secondary | ICD-10-CM | POA: Diagnosis not present

## 2017-03-14 DIAGNOSIS — H353111 Nonexudative age-related macular degeneration, right eye, early dry stage: Secondary | ICD-10-CM

## 2017-07-18 ENCOUNTER — Encounter (INDEPENDENT_AMBULATORY_CARE_PROVIDER_SITE_OTHER): Payer: Medicare Other | Admitting: Ophthalmology

## 2017-07-18 DIAGNOSIS — D3132 Benign neoplasm of left choroid: Secondary | ICD-10-CM

## 2017-07-18 DIAGNOSIS — H43813 Vitreous degeneration, bilateral: Secondary | ICD-10-CM

## 2017-07-18 DIAGNOSIS — H353111 Nonexudative age-related macular degeneration, right eye, early dry stage: Secondary | ICD-10-CM

## 2017-07-18 DIAGNOSIS — H353122 Nonexudative age-related macular degeneration, left eye, intermediate dry stage: Secondary | ICD-10-CM | POA: Diagnosis not present

## 2017-09-30 ENCOUNTER — Encounter: Payer: Self-pay | Admitting: Family Medicine

## 2017-09-30 ENCOUNTER — Ambulatory Visit: Payer: Medicare Other | Admitting: Family Medicine

## 2017-09-30 VITALS — BP 118/84 | Ht 59.75 in | Wt 148.0 lb

## 2017-09-30 DIAGNOSIS — I878 Other specified disorders of veins: Secondary | ICD-10-CM | POA: Diagnosis not present

## 2017-09-30 DIAGNOSIS — M792 Neuralgia and neuritis, unspecified: Secondary | ICD-10-CM

## 2017-09-30 MED ORDER — GABAPENTIN 100 MG PO CAPS: ORAL_CAPSULE | ORAL | 5 refills | Status: DC

## 2017-09-30 NOTE — Progress Notes (Signed)
   Subjective:    Patient ID: Ariel Cook, female    DOB: July 11, 1945, 72 y.o.   MRN: [id number]  Foot Pain  This is a new problem. The current episode started 1 to 4 weeks ago. Associated symptoms comments: Toes turn blue when leg swell, burning and stinging on side of foot. She has tried acetaminophen for the symptoms.   (Right foot )   Right foot burning worse on the medial foot  At times the foot throbs and   Also stinging   Can occur more   Hurts when walking   Sometimes at night     asthma overall a whole lot bette, occas flare up, not using the steroid inhaler, walking helping, uses rescue only rarely     Legs and ankles swell at tines, usually swells later in the day.  Review of Systems No headache, no major weight loss or weight gain, no chest pain no back pain abdominal pain no change in bowel habits complete ROS otherwise negative     Objective:   Physical Exam Alert and oriented, vitals reviewed and stable, NAD ENT-TM's and ext canals WNL bilat via otoscopic exam Soft palate, tonsils and post pharynx WNL via oropharyngeal exam Neck-symmetric, no masses; thyroid nonpalpable and nontender Pulmonary-no tachypnea or accessory muscle use; Clear without wheezes via auscultation Card--no abnrml murmurs, rhythm reg and rate WNL Carotid pulses symmetric, without bruits Feet bilateral pulses arterial intact and normal.  Mild venous stasis changes both ankles.  Right foot good range of motion no point tenderness.  Negative straight leg raise.  Bilateral.       Assessment & Plan:  1 impression neuropathic pain.  Primarily right foot.  Discussed.  Could do substantial work-up nerve conduction studies etc. discussed patient refers not to at this time.  Potential interventions discussed including Neurontin as needed.  2.  Venous stasis discussed evaluated in the past feel no need for further testing.  Petra Kuba of condition discussed with patient  3.  Asthma clinically  stable.  Patient not using steroid inhaler at this time.  Overall doing well off of it  Greater than 50% of this 25 minute face to face visit was spent in counseling and discussion and coordination of care regarding the above diagnosis/diagnosies  Go ahead and remove Flovent from chronic medication list for now.  Neurontin 1 in the evening as needed.  Potential drowsiness discussed wellness plus chronic in 6 months

## 2017-12-19 ENCOUNTER — Encounter (INDEPENDENT_AMBULATORY_CARE_PROVIDER_SITE_OTHER): Payer: Medicare Other | Admitting: Ophthalmology

## 2018-01-23 ENCOUNTER — Telehealth: Payer: Self-pay | Admitting: Family Medicine

## 2018-01-23 NOTE — Telephone Encounter (Signed)
Having Physical on November 7th and wants orders put in to have labs done at [place].  Also wants order put in to have her Mamogram done at [facility]. Will be due after 02-17-18.

## 2018-01-23 NOTE — Telephone Encounter (Signed)
Pt had A1c and Bmet,hepatic fx, lipid done on 02/24/2017. She also wants a mammogram done at [facility]. Please advise.

## 2018-01-24 ENCOUNTER — Other Ambulatory Visit: Payer: Self-pay | Admitting: Family Medicine

## 2018-01-24 DIAGNOSIS — Z79899 Other long term (current) drug therapy: Secondary | ICD-10-CM

## 2018-01-24 DIAGNOSIS — Z780 Asymptomatic menopausal state: Secondary | ICD-10-CM

## 2018-01-24 DIAGNOSIS — Z0000 Encounter for general adult medical examination without abnormal findings: Secondary | ICD-10-CM

## 2018-01-24 DIAGNOSIS — E785 Hyperlipidemia, unspecified: Secondary | ICD-10-CM

## 2018-01-24 DIAGNOSIS — R739 Hyperglycemia, unspecified: Secondary | ICD-10-CM

## 2018-01-24 NOTE — Telephone Encounter (Signed)
Pt returned call and informed that blood work orders were placed and informed patient of mammogram appt. Pt verbalized understanding. Will mail papers to patient.

## 2018-01-24 NOTE — Telephone Encounter (Signed)
Left message to return call. Mammogram scheduled for Feb 22 2018 at 8:00 am at [facility]. Blood work orders placed. Labs mailed to patient.

## 2018-01-24 NOTE — Telephone Encounter (Signed)
Rep same b w plus cbc, order mammo

## 2018-02-03 ENCOUNTER — Encounter (INDEPENDENT_AMBULATORY_CARE_PROVIDER_SITE_OTHER): Payer: Medicare Other | Admitting: Ophthalmology

## 2018-02-03 DIAGNOSIS — H353122 Nonexudative age-related macular degeneration, left eye, intermediate dry stage: Secondary | ICD-10-CM | POA: Diagnosis not present

## 2018-02-03 DIAGNOSIS — H43813 Vitreous degeneration, bilateral: Secondary | ICD-10-CM

## 2018-02-03 DIAGNOSIS — D3132 Benign neoplasm of left choroid: Secondary | ICD-10-CM

## 2018-02-03 DIAGNOSIS — H353111 Nonexudative age-related macular degeneration, right eye, early dry stage: Secondary | ICD-10-CM

## 2018-02-13 ENCOUNTER — Ambulatory Visit: Payer: Medicare Other | Admitting: Family Medicine

## 2018-02-13 ENCOUNTER — Encounter: Payer: Self-pay | Admitting: Family Medicine

## 2018-02-13 VITALS — BP 128/76 | Temp 98.3°F | Ht 59.75 in | Wt 153.0 lb

## 2018-02-13 DIAGNOSIS — J019 Acute sinusitis, unspecified: Secondary | ICD-10-CM

## 2018-02-13 MED ORDER — PREDNISONE 10 MG PO TABS: ORAL_TABLET | ORAL | 0 refills | Status: DC

## 2018-02-13 MED ORDER — AMOXICILLIN 500 MG PO CAPS: 500.0000 mg | ORAL_CAPSULE | Freq: Three times a day (TID) | ORAL | 0 refills | Status: AC

## 2018-02-13 NOTE — Progress Notes (Signed)
   Subjective:    Patient ID: Ariel Cook, female    DOB: 03-Nov-1945, 72 y.o.   MRN: [id number]  Sinusitis  This is a new problem. Episode onset: 2 weeks. Associated symptoms include congestion, coughing, ear pain and headaches. Pertinent negatives include no sore throat. Past treatments include acetaminophen.   Reports symptoms for 2 weeks.  Reports both ears feel closed off, having difficulty hearing even with hearing aids, reports ringing in ears. Cough producing thick white mucous. Reports frontal headaches.  Dizziness when changing positions. Reports using albuterol inhaler when she's walking. Intermittently losing her voice.   Review of Systems  Constitutional: Negative for fever.  HENT: Positive for congestion and ear pain. Negative for sore throat.   Respiratory: Positive for cough.   Neurological: Positive for headaches.       Objective:   Physical Exam  Constitutional: She is oriented to person, place, and time. She appears well-developed and well-nourished. No distress.  HENT:  Head: Normocephalic and atraumatic.  Right Ear: Tympanic membrane is retracted. Tympanic membrane is not erythematous.  Left Ear: Tympanic membrane is retracted. Tympanic membrane is not erythematous.  Nose: No sinus tenderness.  Mouth/Throat: Uvula is midline. Posterior oropharyngeal erythema present.  Eyes: Right eye exhibits no discharge. Left eye exhibits no discharge.  Neck: Neck supple.  Cardiovascular: Normal rate, regular rhythm and normal heart sounds.  Pulmonary/Chest: Effort normal. No respiratory distress. She has no wheezes.  Cough with evidence of bronchial constriction during auscultation  Lymphadenopathy:    She has no cervical adenopathy.  Neurological: She is alert and oriented to person, place, and time.  Skin: Skin is warm and dry.  Psychiatric: She has a normal mood and affect.  Nursing note and vitals reviewed.     Assessment & Plan:  Acute rhinosinusitis  Given  patient's duration of symptoms and her hx of asthma it is necessary to treat with antibiotic, amoxicillin prescribed. Will also prescribe a prednisone taper.  Encouraged her to use her albuterol inhaler prn.  She will f/u if her symptoms worsen or fail to improve.  As attending physician to this patient visit, this patient was seen in conjunction with the nurse practitioner.  The history,physical and treatment plan was reviewed with the nurse practitioner and pertinent findings were verified with the patient.  Also the treatment plan was reviewed with the patient while they were present. WLMD

## 2018-02-22 ENCOUNTER — Ambulatory Visit (HOSPITAL_COMMUNITY)
Admission: RE | Admit: 2018-02-22 | Discharge: 2018-02-22 | Disposition: A | Discharge status: Home / Self Care | Payer: Medicare Other | Source: Ambulatory Visit | Attending: Family Medicine | Admitting: Family Medicine

## 2018-02-22 DIAGNOSIS — Z780 Asymptomatic menopausal state: Secondary | ICD-10-CM | POA: Diagnosis not present

## 2018-02-22 DIAGNOSIS — Z1231 Encounter for screening mammogram for malignant neoplasm of breast: Secondary | ICD-10-CM | POA: Diagnosis present

## 2018-02-23 ENCOUNTER — Encounter: Payer: Self-pay | Admitting: Family Medicine

## 2018-02-23 LAB — BASIC METABOLIC PANEL
BUN/Creatinine Ratio: 12 (ref 12–28)
BUN: 12 mg/dL (ref 8–27)
CALCIUM: 9.5 mg/dL (ref 8.7–10.3)
CO2: 28 mmol/L (ref 20–29)
CREATININE: 1 mg/dL (ref 0.57–1.00)
Chloride: 99 mmol/L (ref 96–106)
GFR calc Af Amer: 65 mL/min/{1.73_m2} (ref >59)
GFR, EST NON AFRICAN AMERICAN: 57 mL/min/{1.73_m2} — AB (ref >59)
Glucose: 92 mg/dL (ref 65–99)
Potassium: 4.3 mmol/L (ref 3.5–5.2)
Sodium: 142 mmol/L (ref 134–144)

## 2018-02-23 LAB — CBC WITH DIFFERENTIAL/PLATELET
BASOS ABS: 0.1 10*3/uL (ref 0.0–0.2)
Basos: 1 %
EOS (ABSOLUTE): 0.2 10*3/uL (ref 0.0–0.4)
Eos: 2 %
HEMOGLOBIN: 13.1 g/dL (ref 11.1–15.9)
Hematocrit: 39.7 % (ref 34.0–46.6)
Immature Grans (Abs): 0 10*3/uL (ref 0.0–0.1)
Immature Granulocytes: 1 %
LYMPHS ABS: 4.1 10*3/uL — AB (ref 0.7–3.1)
Lymphs: 57 %
MCH: 30 pg (ref 26.6–33.0)
MCHC: 33 g/dL (ref 31.5–35.7)
MCV: 91 fL (ref 79–97)
Monocytes Absolute: 0.7 10*3/uL (ref 0.1–0.9)
Monocytes: 9 %
NEUTROS ABS: 2.2 10*3/uL (ref 1.4–7.0)
Neutrophils: 30 %
Platelets: 311 10*3/uL (ref 150–450)
RBC: 4.36 x10E6/uL (ref 3.77–5.28)
RDW: 12.5 % (ref 12.3–15.4)
WBC: 7.2 10*3/uL (ref 3.4–10.8)

## 2018-02-23 LAB — HEPATIC FUNCTION PANEL
ALBUMIN: 4.1 g/dL (ref 3.5–4.8)
ALT: 11 IU/L (ref 0–32)
AST: 13 IU/L (ref 0–40)
Alkaline Phosphatase: 88 IU/L (ref 39–117)
Bilirubin Total: 0.5 mg/dL (ref 0.0–1.2)
Bilirubin, Direct: 0.12 mg/dL (ref 0.00–0.40)
TOTAL PROTEIN: 6.4 g/dL (ref 6.0–8.5)

## 2018-02-23 LAB — LIPID PANEL
CHOL/HDL RATIO: 2.9 ratio (ref 0.0–4.4)
Cholesterol, Total: 231 mg/dL — ABNORMAL HIGH (ref 100–199)
HDL: 81 mg/dL (ref >39)
LDL CALC: 120 mg/dL — AB (ref 0–99)
Triglycerides: 148 mg/dL (ref 0–149)
VLDL Cholesterol Cal: 30 mg/dL (ref 5–40)

## 2018-02-23 LAB — HEMOGLOBIN A1C
ESTIMATED AVERAGE GLUCOSE: 114 mg/dL
Hgb A1c MFr Bld: 5.6 % (ref 4.8–5.6)

## 2018-03-09 ENCOUNTER — Encounter: Payer: Self-pay | Admitting: Family Medicine

## 2018-03-09 ENCOUNTER — Ambulatory Visit (INDEPENDENT_AMBULATORY_CARE_PROVIDER_SITE_OTHER): Payer: Medicare Other | Admitting: Family Medicine

## 2018-03-09 ENCOUNTER — Other Ambulatory Visit: Payer: Self-pay | Admitting: *Deleted

## 2018-03-09 VITALS — BP 132/78 | Ht 61.0 in | Wt 154.8 lb

## 2018-03-09 DIAGNOSIS — J4540 Moderate persistent asthma, uncomplicated: Secondary | ICD-10-CM

## 2018-03-09 DIAGNOSIS — Z23 Encounter for immunization: Secondary | ICD-10-CM

## 2018-03-09 DIAGNOSIS — R0789 Other chest pain: Secondary | ICD-10-CM | POA: Diagnosis not present

## 2018-03-09 DIAGNOSIS — Z0000 Encounter for general adult medical examination without abnormal findings: Secondary | ICD-10-CM

## 2018-03-09 DIAGNOSIS — Z1211 Encounter for screening for malignant neoplasm of colon: Secondary | ICD-10-CM

## 2018-03-09 MED ORDER — ALBUTEROL SULFATE HFA 108 (90 BASE) MCG/ACT IN AERS: 2.0000 | INHALATION_SPRAY | Freq: Four times a day (QID) | RESPIRATORY_TRACT | 5 refills | Status: DC | PRN

## 2018-03-09 MED ORDER — FLUTICASONE PROPIONATE HFA 110 MCG/ACT IN AERO: 2.0000 | INHALATION_SPRAY | Freq: Two times a day (BID) | RESPIRATORY_TRACT | 11 refills | Status: DC

## 2018-03-09 MED ORDER — ZOSTER VAC RECOMB ADJUVANTED 50 MCG/0.5ML IM SUSR: 0.5000 mL | Freq: Once | INTRAMUSCULAR | 1 refills | Status: AC

## 2018-03-09 NOTE — Progress Notes (Signed)
Subjective:    Patient ID: Ariel Cook, female    DOB: July 13, 1945, 72 y.o.   MRN: [id number]  HPI AWV- Annual Wellness Visit  The patient was seen for their annual wellness visit. The patient's past medical history, surgical history, and family history were reviewed. Pertinent vaccines were reviewed ( tetanus, pneumonia, shingles, flu) The patient's medication list was reviewed and updated.  The height and weight were entered.  BMI recorded in electronic record elsewhere  Cognitive screening was completed. Outcome of Mini - Cog: pass   Falls /depression screening electronically recorded within record elsewhere  Current tobacco usage: none (All patients who use tobacco were given written and verbal information on quitting)  Recent listing of emergency department/hospitalizations over the past year were reviewed.  current specialist the patient sees on a regular basis: Dr.Vortex- ortho, back   Medicare annual wellness visit patient questionnaire was reviewed.  A written screening schedule for the patient for the next 5-10 years was given. Appropriate discussion of followup regarding next visit was discussed.  Needs colonoscopy.  Wants to get senior flu shot - will go get at pharmacy.  Reports has had pna vaccine - unsure of when but states it was many years ago, states was given in hospital Has had shingles vaccine many years ago at dermatologist office.   Goes to gym 3x per week, walks about 1-2 miles Tries to eat healthy - sporadic eating habits - vegetarian  Denies vaginal bleeding. Denies sexual activity.   Breathing problems: completed course of abx and prednisone since last visit on 02/13/18.  Reports continued cough, occasional need for albuterol inhaler none this week though. Reports taking flovent irregularly. Reports left-sided chest discomfort when taking a deep breath, coughing, tender to touch, no other associated symptoms.   Reports feelings of depressed  mood. Denies SI/HI. States she lives to take care of her son, and has a good support of friends she sees weekly. Declines any treatment.  Review of Systems  Constitutional: Negative for chills, fatigue, fever and unexpected weight change.  HENT: Negative for congestion, ear pain, sinus pressure, sinus pain and sore throat.   Eyes: Negative for discharge and visual disturbance.  Respiratory: Positive for cough. Negative for shortness of breath and wheezing.   Cardiovascular: Positive for chest pain (See HPI). Negative for leg swelling.  Gastrointestinal: Negative for abdominal pain, blood in stool, constipation, diarrhea, nausea and vomiting.  Genitourinary: Negative for difficulty urinating, hematuria, vaginal bleeding and vaginal discharge.  Neurological: Negative for dizziness, weakness, light-headedness and headaches.  Psychiatric/Behavioral: Negative for suicidal ideas.  All other systems reviewed and are negative.      Objective:   Physical Exam  Constitutional: She is oriented to person, place, and time. She appears well-developed and well-nourished. No distress.  HENT:  Head: Normocephalic and atraumatic.  Right Ear: Tympanic membrane normal.  Left Ear: Tympanic membrane normal.  Nose: Nose normal.  Mouth/Throat: Uvula is midline and oropharynx is clear and moist.  Eyes: Pupils are equal, round, and reactive to light. Conjunctivae and EOM are normal. Right eye exhibits no discharge. Left eye exhibits no discharge.  Neck: Neck supple. No thyromegaly present.  Cardiovascular: Normal rate, regular rhythm and normal heart sounds.  No murmur heard. Pulmonary/Chest: Effort normal and breath sounds normal. No respiratory distress. She has no wheezes. She has no rales. She exhibits tenderness (chest wall tenderness to left sternal border).  Abdominal: Soft. Bowel sounds are normal. She exhibits no distension and no mass. There is  no tenderness.  Genitourinary:  Genitourinary Comments:  Pt declines breast or GU exams today, denies problems.  Lymphadenopathy:    She has no cervical adenopathy.  Neurological: She is alert and oriented to person, place, and time. Coordination normal.  Skin: Skin is warm and dry.  Psychiatric: She has a normal mood and affect.  Nursing note and vitals reviewed.      Assessment & Plan:  1. Well adult exam Adult wellness-complete.wellness physical was conducted today. Importance of diet and exercise were discussed in detail.  In addition to this a discussion regarding safety was also covered. We also reviewed over immunizations and gave recommendations regarding current immunization needed for age.   -prevnar given today  -will get senior flu vaccine at pharmacy  -recommended shingrix, rx given In addition to this additional areas were also touched on including: Preventative health exams needed:  Colonoscopy: due, will refer Mammogram UTD, yearly Pap Smear: N/A d/t age  Discussed symptoms of depression, denies SI/HI. Pt declines offer of counseling services referral or medication management. She will seek care in ED or call us immediately if she begins having symptoms of SI/HI. F/u if she decides she wants to pursue treatment options.  Also reported decreased hearing in right ear, has hearing aids, pt will schedule a f/u appt with her audiologist for further evaluation.  Discussed recent lab results with patient.  Patient was advised yearly wellness exam  2. Moderate persistent asthma, unspecified whether complicated Pt with continued cough, no wheezing on exam, no evidence of pna. Chest x-ray reasonable given duration of cough and her tenderness to sternal border, however pt declined. Offered another round of steroid taper and pt declined. Recommended she take her flovent inhaler regularly bid and use albuterol inhaler prn. Refills sent in for both of these. She will f/u if her symptoms worsen or fail to improve, warning signs discussed.    3. Chest wall tenderness: chest pain most likely chest wall tenderness, do not feel this is cardiac related. Pain is reproducible and worse with deep breath/cough. Offered rx strength anti-inflammatory and pt declined, she will take ibuprofen prn with food. Warning signs discussed regarding symptoms of cardiac type chest pain and when she should seek emergency care, pt verbalized understanding.  3. Need for vaccination - Plan: Pneumococcal conjugate vaccine 13-valent  4. Screening for colon cancer - Plan: Ambulatory referral to Gastroenterology  Dr. Mickie Hillier was consulted on this case and is in agreement with the above treatment plan.  25 minutes was spent with the patient.  This statement verifies that 25 minutes was indeed spent with the patient.  More than 50% of this visit-total duration of the visit-was spent in counseling and coordination of care. The issues that the patient came in for today as reflected in the diagnosis (s) please refer to documentation for further details.

## 2018-03-10 ENCOUNTER — Encounter: Payer: Self-pay | Admitting: Family Medicine

## 2018-03-13 ENCOUNTER — Encounter (INDEPENDENT_AMBULATORY_CARE_PROVIDER_SITE_OTHER): Payer: Self-pay | Admitting: *Deleted

## 2018-04-03 ENCOUNTER — Encounter: Payer: Medicare Other | Admitting: Family Medicine

## 2018-05-26 ENCOUNTER — Encounter: Payer: Self-pay | Admitting: Family Medicine

## 2018-05-26 ENCOUNTER — Ambulatory Visit: Payer: Medicare Other | Admitting: Family Medicine

## 2018-05-26 VITALS — BP 122/82 | Temp 98.4°F | Wt 156.2 lb

## 2018-05-26 DIAGNOSIS — J4541 Moderate persistent asthma with (acute) exacerbation: Secondary | ICD-10-CM

## 2018-05-26 DIAGNOSIS — J329 Chronic sinusitis, unspecified: Secondary | ICD-10-CM

## 2018-05-26 MED ORDER — DOXYCYCLINE HYCLATE 100 MG PO TABS: ORAL_TABLET | ORAL | 0 refills | Status: DC

## 2018-05-26 MED ORDER — PREDNISONE 20 MG PO TABS: ORAL_TABLET | ORAL | 0 refills | Status: DC

## 2018-05-26 NOTE — Progress Notes (Signed)
   Subjective:    Patient ID: Ariel Cook, female    DOB: 12/17/1945, 73 y.o.   MRN: [id number]  Cough  This is a recurrent (pt states she coughs all the time) problem. The cough is productive of sputum (white foamy mucus). Associated symptoms comments: Chunks of yellow when blowing nose at times; pt states nose is split on both sides, has been for about 2 months and nose is sore. Treatments tried: cough drops, chew gum, water, flovent. The treatment provided mild relief.   Cough productive,  Wheezy at times, productive   Review of Systems  Respiratory: Positive for cough.        Objective:   Physical Exam  Alert active mild malaise.  HEENT moderate nasal congestion nostrils tender to palpation some internal sores noted pharynx normal lungs bilateral wheezes no tachypnea no crackles heart regular rate and rhythm.      Assessment & Plan:  Impression rhinosinusitis with exacerbation asthma plan patient encouraged to bump up Flovent back to twice a day as ordered.  Prednisone taper.  Doxy twice daily 10 days symptom care discussed

## 2018-10-10 ENCOUNTER — Encounter: Payer: Self-pay | Admitting: Family Medicine

## 2018-10-10 ENCOUNTER — Other Ambulatory Visit (HOSPITAL_COMMUNITY): Payer: Self-pay | Admitting: Orthopedic Surgery

## 2018-10-10 ENCOUNTER — Other Ambulatory Visit: Payer: Self-pay

## 2018-10-10 ENCOUNTER — Other Ambulatory Visit: Payer: Self-pay | Admitting: Orthopedic Surgery

## 2018-10-10 ENCOUNTER — Ambulatory Visit (INDEPENDENT_AMBULATORY_CARE_PROVIDER_SITE_OTHER): Payer: Medicare Other | Admitting: Family Medicine

## 2018-10-10 DIAGNOSIS — J4541 Moderate persistent asthma with (acute) exacerbation: Secondary | ICD-10-CM | POA: Diagnosis not present

## 2018-10-10 DIAGNOSIS — M545 Low back pain, unspecified: Secondary | ICD-10-CM

## 2018-10-10 DIAGNOSIS — I878 Other specified disorders of veins: Secondary | ICD-10-CM | POA: Diagnosis not present

## 2018-10-10 DIAGNOSIS — M792 Neuralgia and neuritis, unspecified: Secondary | ICD-10-CM | POA: Diagnosis not present

## 2018-10-10 MED ORDER — TRIAMTERENE-HCTZ 37.5-25 MG PO CAPS: 1.0000 | ORAL_CAPSULE | ORAL | 5 refills | Status: DC

## 2018-10-10 MED ORDER — ALBUTEROL SULFATE HFA 108 (90 BASE) MCG/ACT IN AERS: 2.0000 | INHALATION_SPRAY | Freq: Four times a day (QID) | RESPIRATORY_TRACT | 5 refills | Status: DC | PRN

## 2018-10-10 MED ORDER — FLUTICASONE PROPIONATE HFA 110 MCG/ACT IN AERO: 2.0000 | INHALATION_SPRAY | Freq: Two times a day (BID) | RESPIRATORY_TRACT | 5 refills | Status: DC

## 2018-10-10 MED ORDER — GABAPENTIN 100 MG PO CAPS: ORAL_CAPSULE | ORAL | 5 refills | Status: DC

## 2018-10-10 NOTE — Progress Notes (Signed)
   Subjective:    Patient ID: Ariel Cook, female    DOB: 1946-01-04, 73 y.o.   MRN: [id number] Audio only  Patient calls for multiple concerns HPIbilateral leg and ankle swelling. Not as active as she once was. Pt states legs aches to touch. Swelling goes down at night. No more sob than normal with her asthma.   Having MRI lumbar spine done on 6/18.    Virtual Visit via Telephone Note  I connected with Ariel Cook on 10/10/18 at  3:00 PM EDT by telephone and verified that I am speaking with the correct person using two identifiers.  Location: Patient: home Provider: office   I discussed the limitations, risks, security and privacy concerns of performing an evaluation and management service by telephone and the availability of in person appointments. I also discussed with the patient that there may be a patient responsible charge related to this service. The patient expressed understanding and agreed to proceed.   History of Present Illness:    Observations/Objective:   Assessment and Plan:   Follow Up Instructions:    I discussed the assessment and treatment plan with the patient. The patient was provided an opportunity to ask questions and all were answered. The patient agreed with the plan and demonstrated an understanding of the instructions.   The patient was advised to call back or seek an in-person evaluation if the symptoms worsen or if the condition fails to improve as anticipated.  I provided 25 minutes of non-face-to-face time during this encounter.   Asthma better, compliant with meds.  Still using preventive Flovent regularly.  Rare use of albuterol.  Had a flare earlier this spring.  Had some prednisone left over.  Proceeded to take in and now definitely better  Venous stasis, ongoing in nature.  Dyazide helped quite a bit in the past.  Tries to avoid excess salt intake.  Had come off the fluid pill having a lot more difficulties now.  Would like to  restart.  Also dealing with chronic neuropathic discomfort of both feet.  Uses gabapentin intermittently.  Some side effects with it but definitely helps the feet.  Would like to have on hand   Review of Systems No headache, no major weight loss or weight gain, no chest pain no back pain abdominal pain no change in bowel habits complete ROS otherwise negative     Objective:   Physical Exam  Virtual visit     Assessment & Plan:  Impression asthma overall stable to maintain Flovent warning signs discussed proper use discussed of rescue  2.  Chronic neuropathic pain.  Gabapentin helps.  Uses more as needed.  Will maintain  3.  Chronic venous stasis salt intake discussed.  Local measures discussed.  Patient still likes to have Dyazide this is refilled  Follow-up in 6 months diet exercise discussed

## 2018-10-19 ENCOUNTER — Ambulatory Visit (HOSPITAL_COMMUNITY)
Admission: RE | Admit: 2018-10-19 | Discharge: 2018-10-19 | Disposition: A | Discharge status: Home / Self Care | Payer: Medicare Other | Source: Ambulatory Visit | Attending: Orthopedic Surgery | Admitting: Orthopedic Surgery

## 2018-10-19 ENCOUNTER — Other Ambulatory Visit: Payer: Self-pay

## 2018-10-19 DIAGNOSIS — M545 Low back pain, unspecified: Secondary | ICD-10-CM

## 2019-01-05 ENCOUNTER — Telehealth: Payer: Self-pay | Admitting: Family Medicine

## 2019-01-05 DIAGNOSIS — Z1211 Encounter for screening for malignant neoplasm of colon: Secondary | ICD-10-CM

## 2019-01-05 NOTE — Telephone Encounter (Signed)
Pt aware dr Richardson Landry out of office til Tuesday and she will get a call back on Tuesday.

## 2019-01-05 NOTE — Telephone Encounter (Signed)
Patient said she hasn't gotten her colonoscopy because she can never get anyone to answer the phone at Dr. Olevia Perches office. Said she has been trying for 2 years?  Can we set up an appt for her or can she do the colonguard kit she sees on tv?

## 2019-01-08 NOTE — Telephone Encounter (Signed)
Call pt, ask her if willing to see any other docs other than [facility] or is [facility] the only one. Then lets do referrall to [facility] I doc or docs she specifies. Someone explain to her ref process plz

## 2019-01-09 NOTE — Telephone Encounter (Signed)
Pt wanted to stick with dr Laural Golden. Referral put in and pt notiifed we would send referral

## 2019-01-19 ENCOUNTER — Encounter: Payer: Self-pay | Admitting: Family Medicine

## 2019-01-22 ENCOUNTER — Encounter (INDEPENDENT_AMBULATORY_CARE_PROVIDER_SITE_OTHER): Payer: Self-pay | Admitting: *Deleted

## 2019-02-08 ENCOUNTER — Encounter (INDEPENDENT_AMBULATORY_CARE_PROVIDER_SITE_OTHER): Payer: Medicare Other | Admitting: Ophthalmology

## 2019-02-08 DIAGNOSIS — H353132 Nonexudative age-related macular degeneration, bilateral, intermediate dry stage: Secondary | ICD-10-CM

## 2019-02-08 DIAGNOSIS — H43813 Vitreous degeneration, bilateral: Secondary | ICD-10-CM

## 2019-02-08 DIAGNOSIS — D3132 Benign neoplasm of left choroid: Secondary | ICD-10-CM | POA: Diagnosis not present

## 2019-04-20 ENCOUNTER — Other Ambulatory Visit: Payer: Self-pay | Admitting: Family Medicine

## 2019-04-20 NOTE — Telephone Encounter (Signed)
Actually 6 9 2020 for chronic may ref times one

## 2019-05-26 IMAGING — MG DIGITAL SCREENING BILATERAL MAMMOGRAM WITH TOMO AND CAD
8 series · 9 of 24 positions shown · non-contrast
Comparison: Previous exam(s).

CLINICAL DATA: Screening.

EXAM:
DIGITAL SCREENING BILATERAL MAMMOGRAM WITH TOMO AND CAD

[R CC synth-2D]
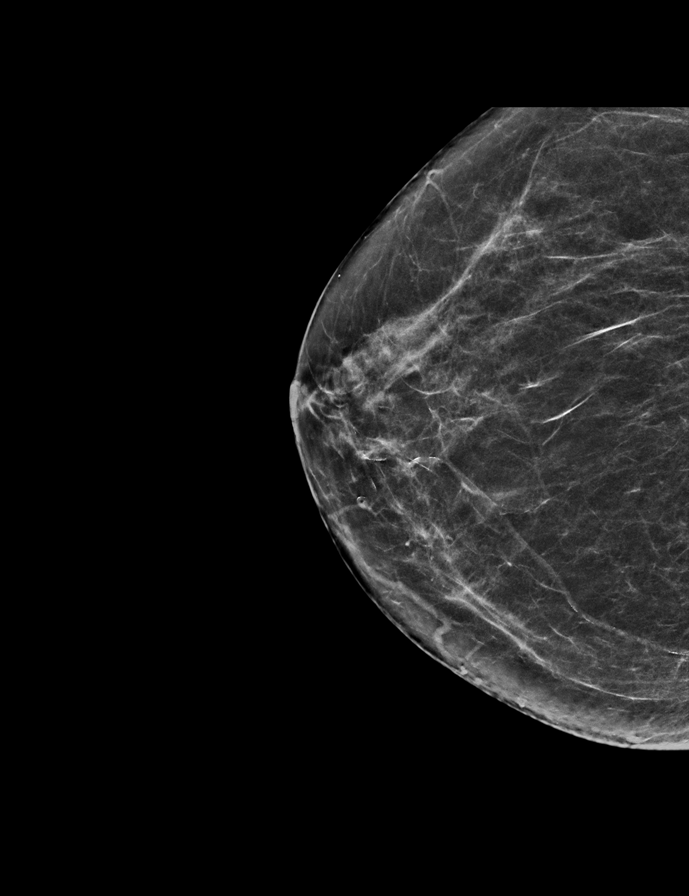

[L CC synth-2D]
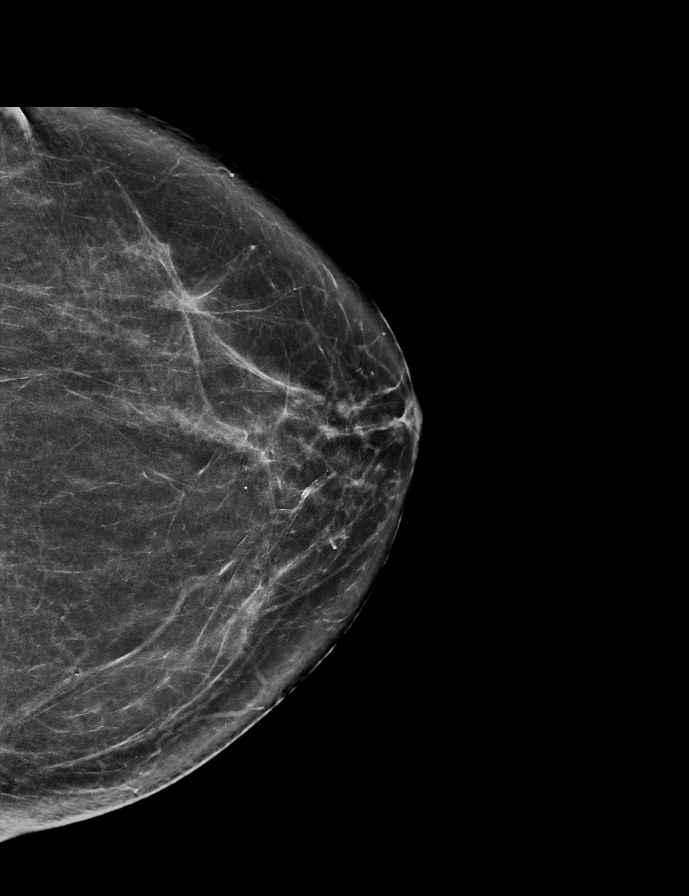

[L MLO synth-2D]
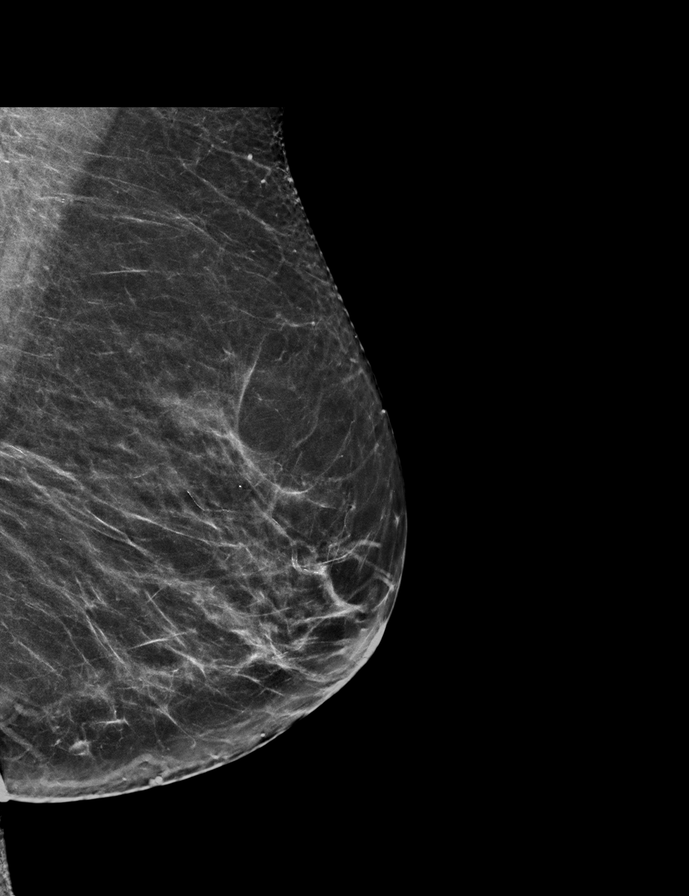

[R MLO synth-2D]
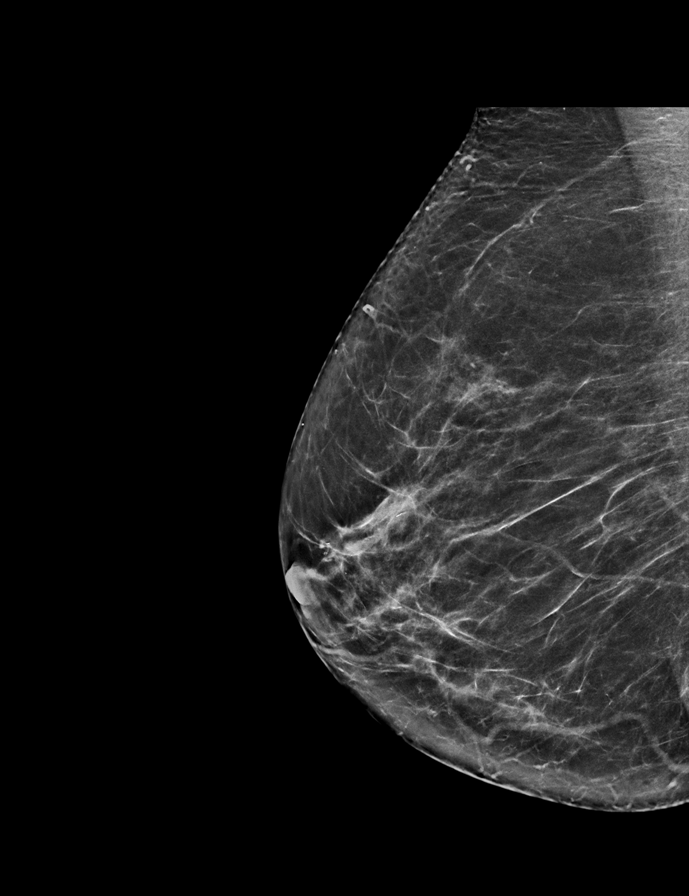

[R CC tomo · 2 of 59 frames shown]
[frame 20/59]
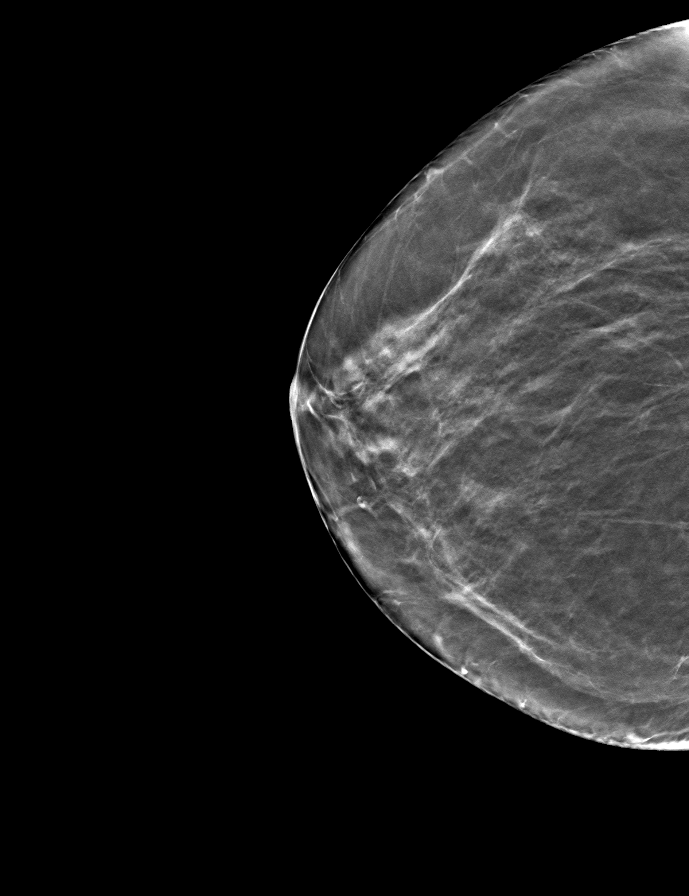
[frame 30/59]
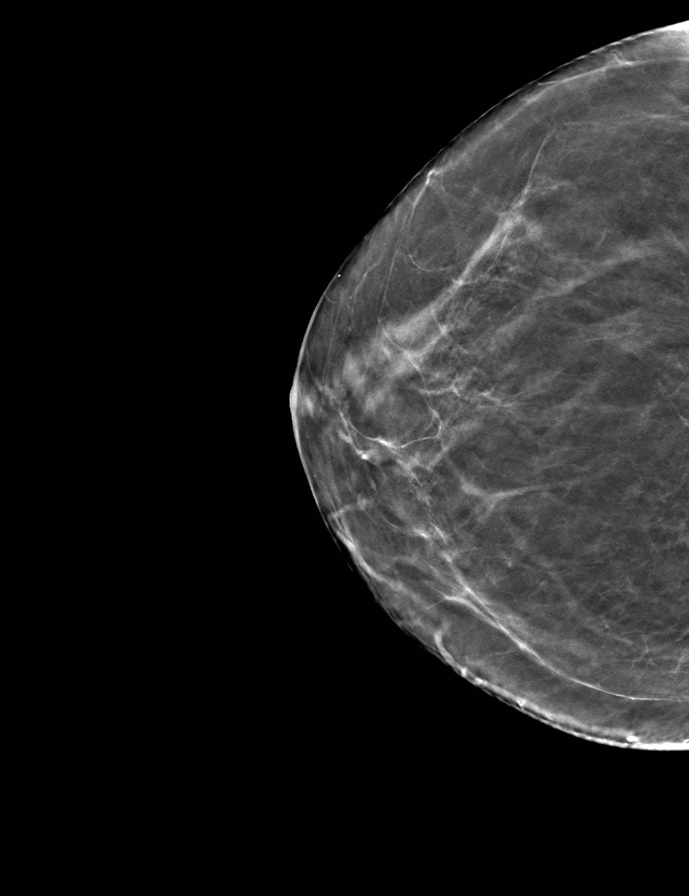

[R MLO tomo · tomo slice 33/65.0]
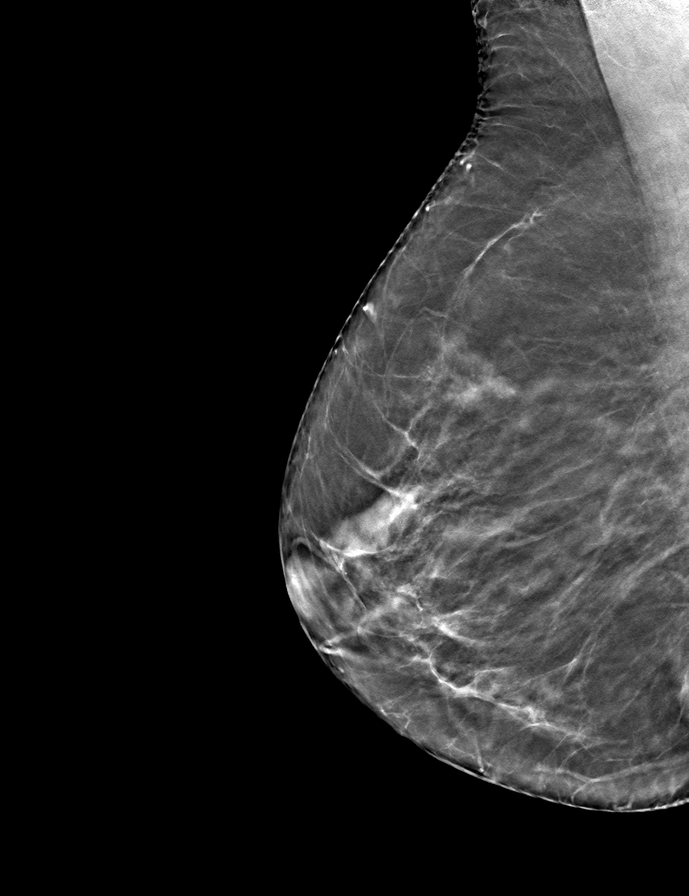

[L CC tomo · tomo slice 35/69.0]
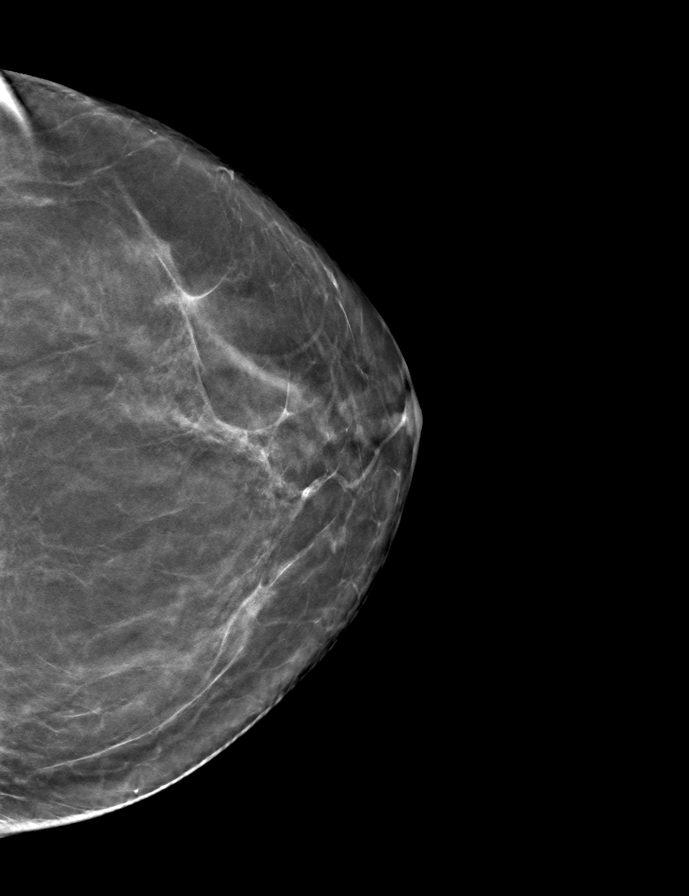

[L MLO tomo · tomo slice 34/67.0]
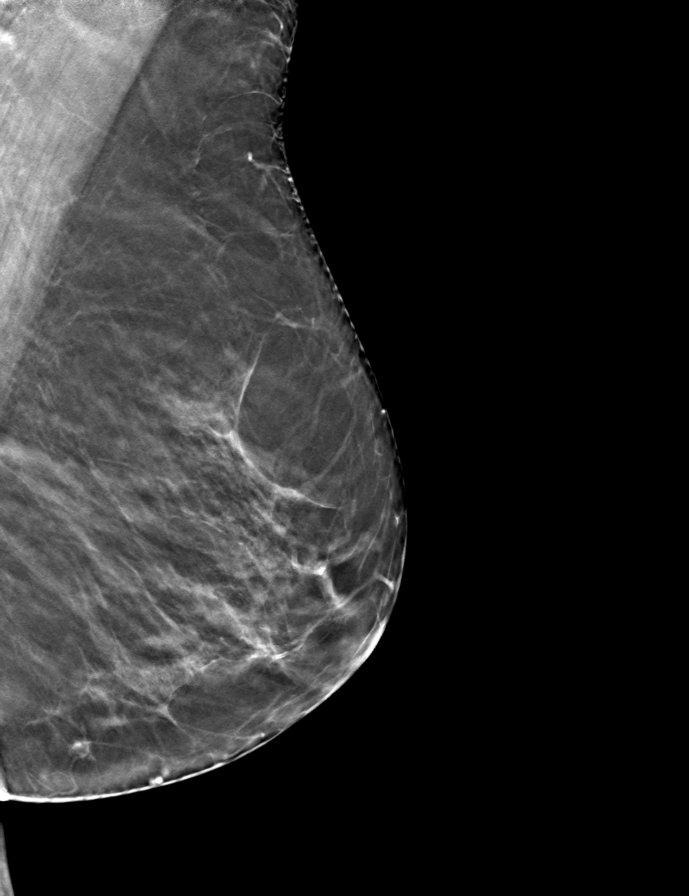

[9 of 24 positions shown; findings below may reference images not displayed]

ACR Breast Density Category b: There are scattered areas of
fibroglandular density.
FINDINGS: There are no findings suspicious for malignancy. Images were
processed with CAD.
IMPRESSION: No mammographic evidence of malignancy. A result letter of this
screening mammogram will be mailed directly to the patient.

RECOMMENDATION:
Screening mammogram in one year. (Code:CN-[id number])

BI-RADS CATEGORY  1: Negative.

## 2019-06-07 ENCOUNTER — Encounter: Payer: Self-pay | Admitting: Family Medicine

## 2019-07-24 ENCOUNTER — Other Ambulatory Visit: Payer: Self-pay | Admitting: Family Medicine

## 2019-08-07 ENCOUNTER — Encounter: Payer: Self-pay | Admitting: Family Medicine

## 2019-08-07 ENCOUNTER — Ambulatory Visit: Payer: Medicare PPO | Admitting: Family Medicine

## 2019-08-07 ENCOUNTER — Other Ambulatory Visit: Payer: Self-pay

## 2019-08-07 VITALS — BP 126/78 | HR 98 | Temp 97.6°F | Ht 59.75 in | Wt 160.0 lb

## 2019-08-07 DIAGNOSIS — Z0000 Encounter for general adult medical examination without abnormal findings: Secondary | ICD-10-CM

## 2019-08-07 DIAGNOSIS — J4530 Mild persistent asthma, uncomplicated: Secondary | ICD-10-CM

## 2019-08-07 DIAGNOSIS — F321 Major depressive disorder, single episode, moderate: Secondary | ICD-10-CM

## 2019-08-07 DIAGNOSIS — F419 Anxiety disorder, unspecified: Secondary | ICD-10-CM | POA: Diagnosis not present

## 2019-08-07 DIAGNOSIS — F329 Major depressive disorder, single episode, unspecified: Secondary | ICD-10-CM

## 2019-08-07 DIAGNOSIS — F32A Depression, unspecified: Secondary | ICD-10-CM

## 2019-08-07 MED ORDER — SERTRALINE HCL 25 MG PO TABS: 25.0000 mg | ORAL_TABLET | Freq: Every day | ORAL | 1 refills | Status: DC

## 2019-08-07 NOTE — Progress Notes (Signed)
Subjective:    Patient ID: Ariel Cook, female    DOB: 05/10/1945, 74 y.o.   MRN: [id number]  HPI  Patient arrives to discuss problems with her asthma for over a year since being shut in for pandemic. Patient states she was walking and visiting her son and keeping active but has not been able to do that in a year. Some days her breathing is not so bad and some days she has more issues.  Patient also states she has been very depressed since the pandemic shut down. Patient states she is very close with her son who is in a group home and was going to visit him at least every other week but has not been able to visit  him in over a year. See PHQ-9  Pt having some sob with exertion and some coughing.  Feeling at end of day some hoarseness.  Has been using flovent daily.  Not using her albuterol.  Stating that albuterol makes her jittery.  Denies fever, congestion, or other uri symptoms.   Review of Systems  Constitutional: Negative for chills and fever.  HENT: Negative for congestion, rhinorrhea and sore throat.   Respiratory: Positive for shortness of breath. Negative for cough and wheezing.   Cardiovascular: Negative for chest pain and leg swelling.  Gastrointestinal: Negative for abdominal pain, diarrhea, nausea and vomiting.  Genitourinary: Negative for dysuria and frequency.  Musculoskeletal: Negative for arthralgias and back pain.  Skin: Negative for rash.  Neurological: Negative for dizziness, weakness and headaches.  Psychiatric/Behavioral: Positive for dysphoric mood. Negative for agitation, confusion, hallucinations, self-injury, sleep disturbance and suicidal ideas. The patient is nervous/anxious.        Objective:   Physical Exam Vitals and nursing note reviewed.  Constitutional:      General: She is not in acute distress.    Appearance: Normal appearance.  HENT:     Head: Normocephalic and atraumatic.     Nose: Nose normal.     Mouth/Throat:     Mouth: Mucous  membranes are moist.     Pharynx: Oropharynx is clear.  Eyes:     Extraocular Movements: Extraocular movements intact.     Conjunctiva/sclera: Conjunctivae normal.     Pupils: Pupils are equal, round, and reactive to light.  Cardiovascular:     Rate and Rhythm: Normal rate and regular rhythm.     Pulses: Normal pulses.     Heart sounds: Normal heart sounds.  Pulmonary:     Effort: Pulmonary effort is normal.     Breath sounds: Normal breath sounds. No wheezing, rhonchi or rales.  Musculoskeletal:        General: Normal range of motion.     Right lower leg: No edema.     Left lower leg: No edema.  Skin:    General: Skin is warm and dry.     Findings: No lesion or rash.  Neurological:     General: No focal deficit present.     Mental Status: She is alert and oriented to person, place, and time.     Cranial Nerves: No cranial nerve deficit.  Psychiatric:        Behavior: Behavior normal.        Thought Content: Thought content normal.        Judgment: Judgment normal.     Comments: +depressed mood        Assessment & Plan:   1. Depression, unspecified depression type Will start trial of zoloft.   -  sertraline (ZOLOFT) 25 MG tablet; Take 1 tablet (25 mg total) by mouth daily.  Dispense: 30 tablet; Refill: 1  2. Anxiety -see above.   - sertraline (ZOLOFT) 25 MG tablet; Take 1 tablet (25 mg total) by mouth daily.  Dispense: 30 tablet; Refill: 1  3. Mild persistent asthma, unspecified whether complicated -pt wanting to wait before increasing her daily flovent inhaler.  Will re-assess on her next visit.  4. Laboratory tests ordered as part of a complete physical exam (CPE)  - Lipid panel - CBC - HgB A1c - Comprehensive metabolic panel  Time:  Spent greater than 80mins face-to-face with coordination of care, counseling, exam, and treatment plan.  F/u 4 wks for recheck and physical exam.

## 2019-08-08 ENCOUNTER — Telehealth: Payer: Self-pay | Admitting: Family Medicine

## 2019-08-08 NOTE — Telephone Encounter (Signed)
Pt was prescribed zoloft yesterday and she has macular degeneration. She wanted to make sure Dr. Lovena Le was aware of that. She has a history of glaucoma in her family and wants to double check before she starts taking the medication that it is ok for her to take.

## 2019-08-09 DIAGNOSIS — F32A Depression, unspecified: Secondary | ICD-10-CM | POA: Insufficient documentation

## 2019-08-09 DIAGNOSIS — F329 Major depressive disorder, single episode, unspecified: Secondary | ICD-10-CM | POA: Insufficient documentation

## 2019-08-09 NOTE — Telephone Encounter (Signed)
Left message for pt to return call.

## 2019-08-09 NOTE — Telephone Encounter (Signed)
Discussed with pt. Pt verbalized understanding.  °

## 2019-08-09 NOTE — Telephone Encounter (Signed)
Pls let pt know, I haven't heard of any of these side effects with glaucoma or macular degeneration.  But pt just needs to continue yearly eye exams.    Thanks,   Dr. Lovena Le

## 2019-08-15 ENCOUNTER — Ambulatory Visit: Payer: Medicare Other | Admitting: Family Medicine

## 2019-08-24 ENCOUNTER — Other Ambulatory Visit: Payer: Self-pay

## 2019-08-24 ENCOUNTER — Encounter: Payer: Self-pay | Admitting: Family Medicine

## 2019-08-24 ENCOUNTER — Ambulatory Visit: Payer: Medicare PPO | Admitting: Family Medicine

## 2019-08-24 VITALS — BP 152/84 | Temp 97.8°F

## 2019-08-24 DIAGNOSIS — M5442 Lumbago with sciatica, left side: Secondary | ICD-10-CM | POA: Diagnosis not present

## 2019-08-24 DIAGNOSIS — F329 Major depressive disorder, single episode, unspecified: Secondary | ICD-10-CM | POA: Diagnosis not present

## 2019-08-24 DIAGNOSIS — G8929 Other chronic pain: Secondary | ICD-10-CM

## 2019-08-24 DIAGNOSIS — F32A Depression, unspecified: Secondary | ICD-10-CM

## 2019-08-24 MED ORDER — PREDNISONE 10 MG (21) PO TBPK: ORAL_TABLET | Freq: Every day | ORAL | 0 refills | Status: DC

## 2019-08-24 NOTE — Progress Notes (Signed)
Patient ID: Ariel Cook, female    DOB: 12-29-1945, 74 y.o.   MRN: [id number]   Chief Complaint  Patient presents with  . Back Pain   Subjective:   HPI Pt seen for -Left sided low back pain. Has h/o degen disc disease and sciatica in past.  Going on for 1 wk. Worse 2 days ago.  Getting spasm with coughing or breathing deeply.  Has back brace on today.  Was seen by Dr. Lynann Bologna at ortho office in past, but has retired.  Was given zanaflex 4mg  tabs from them, but didn't take them due to being on zoloft.  Wanting to stop the zoloft, feels she doesn't notice a difference and afraid of an interaction with the zanaflex.   Denies dysuria, fever, or hematuria.  Has some numbness/tingling in the left leg. No trauma or injury to the back.  Depression- Pt just started on zoloft last week and pt wanting to stop it. Pt not sure if it's improving, due to not on it long. Got to see her son last week, which was upsetting her due to pandemic she wasn't able to see sone due to him being in a group home.    Medical History Ariel Cook has a past medical history of Asthma, DDD (degenerative disc disease), cervical, Headache(784.0), Left carpal tunnel syndrome (09/03/2011), PONV (postoperative nausea and vomiting), Sciatica, and Unspecified adverse effect of anesthesia.   Outpatient Encounter Medications as of 08/24/2019  Medication Sig  . acetaminophen (TYLENOL) 650 MG CR tablet Take 650 mg by mouth every 8 (eight) hours as needed for pain.  Marland Kitchen albuterol (VENTOLIN HFA) 108 (90 Base) MCG/ACT inhaler Inhale 2 puffs into the lungs every 6 (six) hours as needed for wheezing or shortness of breath.  . Biotin 10000 MCG TABS Take 1 tablet by mouth daily.   . fluticasone (FLOVENT HFA) 110 MCG/ACT inhaler Inhale 2 puffs into the lungs 2 (two) times daily.  . Misc Natural Products (OSTEO BI-FLEX JOINT SHIELD PO) Take 1 tablet by mouth daily.   . Multiple Vitamins-Minerals (ICAPS AREDS 2 PO) Take by mouth.  .  predniSONE (STERAPRED UNI-PAK 21 TAB) 10 MG (21) TBPK tablet Take by mouth daily. take as directed.  . sertraline (ZOLOFT) 25 MG tablet Take 1 tablet (25 mg total) by mouth daily.  Marland Kitchen triamterene-hydrochlorothiazide (DYAZIDE) 37.5-25 MG capsule Take 1 capsule by mouth once daily in the morning   No facility-administered encounter medications on file as of 08/24/2019.     Review of Systems  Constitutional: Negative for chills and fever.  HENT: Negative for congestion, rhinorrhea and sore throat.   Respiratory: Negative for cough, shortness of breath and wheezing.   Cardiovascular: Negative for chest pain and leg swelling.  Gastrointestinal: Negative for abdominal pain, diarrhea, nausea and vomiting.  Genitourinary: Negative for difficulty urinating, dysuria, frequency and hematuria.  Musculoskeletal: Positive for back pain. Negative for arthralgias.  Skin: Negative for rash.  Neurological: Negative for dizziness, weakness and headaches.     Vitals BP (!) 152/84   Temp 97.8 F (36.6 C)   Objective:   Physical Exam Vitals and nursing note reviewed.  Constitutional:      General: She is in acute distress (due to pain).     Appearance: Normal appearance. She is normal weight. She is not ill-appearing.  HENT:     Head: Normocephalic and atraumatic.  Eyes:     Extraocular Movements: Extraocular movements intact.     Conjunctiva/sclera: Conjunctivae normal.  Pupils: Pupils are equal, round, and reactive to light.  Cardiovascular:     Rate and Rhythm: Normal rate and regular rhythm.     Pulses: Normal pulses.     Heart sounds: Normal heart sounds.  Pulmonary:     Effort: Pulmonary effort is normal. No respiratory distress.     Breath sounds: Normal breath sounds. No wheezing, rhonchi or rales.  Musculoskeletal:        General: Tenderness present. No swelling, deformity or signs of injury.     Cervical back: Normal range of motion. Tenderness (bilateral paraspinal cervical  area.) present. No bony tenderness.     Thoracic back: Normal. No tenderness or bony tenderness.     Lumbar back: Tenderness present. No swelling, edema, deformity, signs of trauma or bony tenderness. Decreased range of motion.       Back:     Right lower leg: No edema.     Left lower leg: No edema.     Comments:  ttp on left lumbar paraspinal area and rt buttock and piriformis. Normal inspection, no swelling or rash.      Skin:    General: Skin is warm and dry.     Findings: No lesion or rash.  Neurological:     General: No focal deficit present.     Mental Status: She is alert and oriented to person, place, and time.     Cranial Nerves: No cranial nerve deficit.     Sensory: No sensory deficit.     Motor: No weakness.     Gait: Gait abnormal (due to back pain).     Deep Tendon Reflexes: Reflexes normal.  Psychiatric:        Mood and Affect: Mood normal.        Behavior: Behavior normal.      Assessment and Plan   1. Chronic left-sided low back pain with left-sided sciatica - predniSONE (STERAPRED UNI-PAK 21 TAB) 10 MG (21) TBPK tablet; Take by mouth daily. take as directed.  Dispense: 21 each; Refill: 0  2. Depression, unspecified depression type   Sciatica-left- Advising pt to take zanaflex prn, heat/ice 63mins 3x per day. Avoid driving with zanaflex. Take prednisone with food. May add tylenol prn.  Depression- pt wanting to stop the zoloft.  Feeling better and got to see her son, which was her main focus of her sadness.  Will continue to monitor, advising pt how to taper off meds by taking zoloft every other day for 2 doses.  Then stop.  Pt voiced understanding.  F/u in 7-10 days if not imrproving or prn.  Pt in agreement.

## 2019-08-30 DIAGNOSIS — Z131 Encounter for screening for diabetes mellitus: Secondary | ICD-10-CM | POA: Diagnosis not present

## 2019-08-30 DIAGNOSIS — Z13220 Encounter for screening for lipoid disorders: Secondary | ICD-10-CM | POA: Diagnosis not present

## 2019-08-30 DIAGNOSIS — Z0000 Encounter for general adult medical examination without abnormal findings: Secondary | ICD-10-CM | POA: Diagnosis not present

## 2019-08-31 LAB — LIPID PANEL
Chol/HDL Ratio: 2.7 ratio (ref 0.0–4.4)
Cholesterol, Total: 215 mg/dL — ABNORMAL HIGH (ref 100–199)
HDL: 80 mg/dL (ref >39)
LDL Chol Calc (NIH): 117 mg/dL — ABNORMAL HIGH (ref 0–99)
Triglycerides: 105 mg/dL (ref 0–149)
VLDL Cholesterol Cal: 18 mg/dL (ref 5–40)

## 2019-08-31 LAB — COMPREHENSIVE METABOLIC PANEL
ALT: 16 IU/L (ref 0–32)
AST: 16 IU/L (ref 0–40)
Albumin/Globulin Ratio: 2.2 (ref 1.2–2.2)
Albumin: 4.2 g/dL (ref 3.7–4.7)
Alkaline Phosphatase: 105 IU/L (ref 39–117)
BUN/Creatinine Ratio: 15 (ref 12–28)
BUN: 15 mg/dL (ref 8–27)
Bilirubin Total: 0.4 mg/dL (ref 0.0–1.2)
CO2: 25 mmol/L (ref 20–29)
Calcium: 9.3 mg/dL (ref 8.7–10.3)
Chloride: 100 mmol/L (ref 96–106)
Creatinine, Ser: 1.02 mg/dL — ABNORMAL HIGH (ref 0.57–1.00)
GFR calc Af Amer: 64 mL/min/{1.73_m2} (ref >59)
GFR calc non Af Amer: 55 mL/min/{1.73_m2} — ABNORMAL LOW (ref >59)
Globulin, Total: 1.9 g/dL (ref 1.5–4.5)
Glucose: 83 mg/dL (ref 65–99)
Potassium: 4.2 mmol/L (ref 3.5–5.2)
Sodium: 142 mmol/L (ref 134–144)
Total Protein: 6.1 g/dL (ref 6.0–8.5)

## 2019-08-31 LAB — HEMOGLOBIN A1C
Est. average glucose Bld gHb Est-mCnc: 120 mg/dL
Hgb A1c MFr Bld: 5.8 % — ABNORMAL HIGH (ref 4.8–5.6)

## 2019-08-31 LAB — CBC
Hematocrit: 40.7 % (ref 34.0–46.6)
Hemoglobin: 13.8 g/dL (ref 11.1–15.9)
MCH: 31.9 pg (ref 26.6–33.0)
MCHC: 33.9 g/dL (ref 31.5–35.7)
MCV: 94 fL (ref 79–97)
Platelets: 301 10*3/uL (ref 150–450)
RBC: 4.33 x10E6/uL (ref 3.77–5.28)
RDW: 12.4 % (ref 11.7–15.4)
WBC: 8.9 10*3/uL (ref 3.4–10.8)

## 2019-09-04 ENCOUNTER — Encounter: Payer: Self-pay | Admitting: Family Medicine

## 2019-09-04 ENCOUNTER — Other Ambulatory Visit: Payer: Self-pay

## 2019-09-04 ENCOUNTER — Ambulatory Visit (INDEPENDENT_AMBULATORY_CARE_PROVIDER_SITE_OTHER): Payer: Medicare PPO | Admitting: Family Medicine

## 2019-09-04 ENCOUNTER — Encounter (INDEPENDENT_AMBULATORY_CARE_PROVIDER_SITE_OTHER): Payer: Self-pay | Admitting: *Deleted

## 2019-09-04 VITALS — BP 130/78 | HR 70 | Temp 97.6°F | Ht 59.5 in | Wt 160.2 lb

## 2019-09-04 DIAGNOSIS — M19031 Primary osteoarthritis, right wrist: Secondary | ICD-10-CM | POA: Diagnosis not present

## 2019-09-04 DIAGNOSIS — F419 Anxiety disorder, unspecified: Secondary | ICD-10-CM | POA: Diagnosis not present

## 2019-09-04 DIAGNOSIS — Z1211 Encounter for screening for malignant neoplasm of colon: Secondary | ICD-10-CM | POA: Diagnosis not present

## 2019-09-04 DIAGNOSIS — G4700 Insomnia, unspecified: Secondary | ICD-10-CM | POA: Diagnosis not present

## 2019-09-04 DIAGNOSIS — F321 Major depressive disorder, single episode, moderate: Secondary | ICD-10-CM | POA: Diagnosis not present

## 2019-09-04 DIAGNOSIS — Z0000 Encounter for general adult medical examination without abnormal findings: Secondary | ICD-10-CM

## 2019-09-04 DIAGNOSIS — B370 Candidal stomatitis: Secondary | ICD-10-CM | POA: Diagnosis not present

## 2019-09-04 MED ORDER — NYSTATIN 100000 UNIT/ML MT SUSP: 5.0000 mL | Freq: Four times a day (QID) | OROMUCOSAL | 0 refills | Status: DC

## 2019-09-04 MED ORDER — FLUTICASONE PROPIONATE HFA 110 MCG/ACT IN AERO: 2.0000 | INHALATION_SPRAY | Freq: Two times a day (BID) | RESPIRATORY_TRACT | 5 refills | Status: DC

## 2019-09-04 MED ORDER — SERTRALINE HCL 50 MG PO TABS: 50.0000 mg | ORAL_TABLET | Freq: Every day | ORAL | 3 refills | Status: DC

## 2019-09-04 NOTE — Progress Notes (Signed)
Patient ID: Ariel Cook, female    DOB: January 28, 1946, 74 y.o.   MRN: [id number]   Chief Complaint  Patient presents with  . Annual Exam    Pt here today for Medicare wellness. Pt tries to be healthy with eating habits, pt is vegetarian. Pt states she has not had a colonoscopy at this time. Pt was placed on Zoloft 25 mg but has stopped taking that. pt states her skin is tender and arthritis in left arm is getting worse. Pain when turning left arm.      Subjective:    HPI Pt seen for medicare wellness visit.  Depression/anxiety/insomnia- Also having insomnia with racing thoughts at night.  Started after husband passed years ago.  Has nightmares at times. Has depression/anxiety since not seeing son much.started zoloft for about 1-2 weeks then stopped.  Didn't want to mix with her back medications.  So may want to restart this.  Would be willing to try zoloft again will start 25mg  then go up to 50mg .  She does have anxiety and insomnia.  Not having as much depression now, since been able to see her son in the last 2 weeks since the covid pandemic and isolation improved at his group home.  Has been able to visit the son at the group home.   H/o chronic back pain- Feeling better after last visit with flare up of her chronic back pain.  Finished the prednisone taper.  Not taking the zanaflex.  Only taking tylenol prn. Used to see someone with epidural injections in spine.  Used to see [facility].  Her doctor retired.  Hasn't been wanting to go back. Some times seeing doctor in [place] for orthopedic issues.   Has skin "tenderness" and then pain in left forearm. Discomfort with moving the wrist.  No swelling, redness, or rash.  No trauma or injury to the hand/forearm/wrist. No new exercising or overuse.   Only taking tylenol at night for her back.  No h/o gout.  Feels her taste is decreased in last 3 weeks.  No other uri symptoms. Feels her tongue feels scratchy.  Does use inhalers  daily.   Medical History Ariel Cook has a past medical history of Asthma, DDD (degenerative disc disease), cervical, Headache(784.0), Left carpal tunnel syndrome (09/03/2011), PONV (postoperative nausea and vomiting), Sciatica, and Unspecified adverse effect of anesthesia.   Outpatient Encounter Medications as of 09/04/2019  Medication Sig  . acetaminophen (TYLENOL) 650 MG CR tablet Take 650 mg by mouth every 8 (eight) hours as needed for pain.  Marland Kitchen albuterol (VENTOLIN HFA) 108 (90 Base) MCG/ACT inhaler Inhale 2 puffs into the lungs every 6 (six) hours as needed for wheezing or shortness of breath.  . Biotin 10000 MCG TABS Take 1 tablet by mouth daily.   . fluticasone (FLOVENT HFA) 110 MCG/ACT inhaler Inhale 2 puffs into the lungs 2 (two) times daily.  . Misc Natural Products (OSTEO BI-FLEX JOINT SHIELD PO) Take 1 tablet by mouth daily.   . Multiple Vitamins-Minerals (ICAPS AREDS 2 PO) Take by mouth.  . triamterene-hydrochlorothiazide (DYAZIDE) 37.5-25 MG capsule Take 1 capsule by mouth once daily in the morning  . [DISCONTINUED] fluticasone (FLOVENT HFA) 110 MCG/ACT inhaler Inhale 2 puffs into the lungs 2 (two) times daily.  Marland Kitchen nystatin (MYCOSTATIN) 100000 UNIT/ML suspension Take 5 mLs (500,000 Units total) by mouth 4 (four) times daily.  . sertraline (ZOLOFT) 50 MG tablet Take 1 tablet (50 mg total) by mouth daily.  . [DISCONTINUED] predniSONE (STERAPRED UNI-PAK  21 TAB) 10 MG (21) TBPK tablet Take by mouth daily. take as directed. (Patient not taking: Reported on 09/04/2019)  . [DISCONTINUED] sertraline (ZOLOFT) 25 MG tablet Take 1 tablet (25 mg total) by mouth daily. (Patient not taking: Reported on 09/04/2019)   No facility-administered encounter medications on file as of 09/04/2019.     Review of Systems  Constitutional: Negative for chills and fever.  HENT: Negative for congestion, ear pain, postnasal drip, rhinorrhea, sinus pressure, sinus pain and sore throat.        Tongue scratchy, unable to  taste food   Eyes: Negative for pain, discharge, redness and itching.  Respiratory: Negative for cough, shortness of breath and wheezing.   Cardiovascular: Negative for chest pain and leg swelling.  Gastrointestinal: Negative for abdominal pain, diarrhea, nausea and vomiting.  Genitourinary: Negative for dysuria and frequency.  Musculoskeletal: Positive for back pain (improving). Negative for arthralgias.  Skin: Negative for rash.  Neurological: Negative for dizziness, weakness and headaches.  Psychiatric/Behavioral: Positive for sleep disturbance. Negative for dysphoric mood. The patient is nervous/anxious.      Vitals BP 130/78   Pulse 70   Temp 97.6 F (36.4 C)   Ht 4' 11.5" (1.511 m)   Wt 160 lb 3.2 oz (72.7 kg)   SpO2 98%   BMI 31.81 kg/m   Objective:   Physical Exam Vitals and nursing note reviewed.  Constitutional:      General: She is not in acute distress.    Appearance: Normal appearance. She is not ill-appearing.  HENT:     Head: Normocephalic and atraumatic.     Nose: Nose normal.     Mouth/Throat:     Mouth: Mucous membranes are moist.     Pharynx: Oropharynx is clear.     Comments: Tongue with white coating.  No patches on the buccal mucosa. Eyes:     Extraocular Movements: Extraocular movements intact.     Conjunctiva/sclera: Conjunctivae normal.     Pupils: Pupils are equal, round, and reactive to light.  Cardiovascular:     Rate and Rhythm: Normal rate and regular rhythm.     Pulses: Normal pulses.     Heart sounds: Normal heart sounds.  Pulmonary:     Effort: Pulmonary effort is normal.     Breath sounds: Normal breath sounds. No wheezing, rhonchi or rales.  Musculoskeletal:        General: No swelling, tenderness, deformity or signs of injury. Normal range of motion.     Right lower leg: No edema.     Left lower leg: No edema.     Comments: Dec rom with supination and pronation of the left wrist. No erythema, ecchymosis, warmth, or swelling.  Normal rom of left hand/elbow.  Skin:    General: Skin is warm and dry.     Findings: No lesion or rash.  Neurological:     General: No focal deficit present.     Mental Status: She is alert and oriented to person, place, and time.  Psychiatric:        Mood and Affect: Mood normal.        Behavior: Behavior normal.        Thought Content: Thought content normal.        Judgment: Judgment normal.      Assessment and Plan   1. Medicare annual wellness visit, subsequent  2. Encounter for screening colonoscopy - Ambulatory referral to Gastroenterology  3. Candida, oral - nystatin (MYCOSTATIN) 100000 UNIT/ML suspension;  Take 5 mLs (500,000 Units total) by mouth 4 (four) times daily.  Dispense: 60 mL; Refill: 0  4. Osteoarthritis of right wrist, unspecified osteoarthritis type  5. Current moderate episode of major depressive disorder without prior episode ([place])  6. Anxiety - sertraline (ZOLOFT) 50 MG tablet; Take 1 tablet (50 mg total) by mouth daily.  Dispense: 30 tablet; Refill: 3  7. Insomnia, unspecified type - sertraline (ZOLOFT) 50 MG tablet; Take 1 tablet (50 mg total) by mouth daily.  Dispense: 30 tablet; Refill: 3     1. Medicare wellness- advising to f/u in 1 yr for AMWV.  2.  Depression/anxiety/insomnia- advising to treat the underlying anxiety disorder with racing thoughts and unable to get to sleep.  Advising restarting zoloft 25mg  for 1 week then increase to the 50mg  tablet. Pt also to try melatonin for sleep.    3.  Osteoarthritis- likely in wrist on left.  tylenol bid as needed for pain.  Pt declining to see orthopedics for back or wrist pain at this time.  4.  Thrush- For inhaler use- needing to rinse after early inhalation.  Also gave nystatin for thrush.   F/u 42mo or as needed.  [place], DO 09/04/2019

## 2019-09-04 NOTE — Patient Instructions (Signed)
Melatonin 5mg  1 hr before sleep to help with insomnia.

## 2019-09-05 NOTE — Progress Notes (Signed)
AWV- Annual Wellness Visit  The patient was seen for their annual wellness visit. The patient's past medical history, surgical history, and family history were reviewed. Pertinent vaccines were reviewed ( tetanus, pneumonia, shingles, flu) The patient's medication list was reviewed and updated.  The height and weight were entered.  BMI recorded in electronic record elsewhere  Cognitive screening was completed. Outcome of Mini - Cog: pass   Falls /depression screening electronically recorded within record elsewhere  Current tobacco usage: (All patients who use tobacco were given written and verbal information on quitting)  Recent listing of emergency department/hospitalizations over the past year were reviewed.  current specialist the patient sees on a regular basis:  none   Medicare annual wellness visit patient questionnaire was reviewed.  A written screening schedule for the patient for the next 5-10 years was given. Appropriate discussion of followup regarding next visit was discussed.

## 2019-09-06 DIAGNOSIS — D3132 Benign neoplasm of left choroid: Secondary | ICD-10-CM | POA: Diagnosis not present

## 2019-09-06 DIAGNOSIS — H04123 Dry eye syndrome of bilateral lacrimal glands: Secondary | ICD-10-CM | POA: Diagnosis not present

## 2019-09-06 DIAGNOSIS — H0102A Squamous blepharitis right eye, upper and lower eyelids: Secondary | ICD-10-CM | POA: Diagnosis not present

## 2019-09-06 DIAGNOSIS — H0102B Squamous blepharitis left eye, upper and lower eyelids: Secondary | ICD-10-CM | POA: Diagnosis not present

## 2019-09-06 DIAGNOSIS — H353132 Nonexudative age-related macular degeneration, bilateral, intermediate dry stage: Secondary | ICD-10-CM | POA: Diagnosis not present

## 2019-09-06 DIAGNOSIS — H10413 Chronic giant papillary conjunctivitis, bilateral: Secondary | ICD-10-CM | POA: Diagnosis not present

## 2019-09-06 DIAGNOSIS — H16213 Exposure keratoconjunctivitis, bilateral: Secondary | ICD-10-CM | POA: Diagnosis not present

## 2019-11-09 ENCOUNTER — Other Ambulatory Visit (INDEPENDENT_AMBULATORY_CARE_PROVIDER_SITE_OTHER): Payer: Self-pay | Admitting: *Deleted

## 2019-11-13 ENCOUNTER — Other Ambulatory Visit (INDEPENDENT_AMBULATORY_CARE_PROVIDER_SITE_OTHER): Payer: Self-pay | Admitting: *Deleted

## 2019-11-13 DIAGNOSIS — Z1211 Encounter for screening for malignant neoplasm of colon: Secondary | ICD-10-CM

## 2019-11-14 ENCOUNTER — Telehealth: Payer: Self-pay | Admitting: Family Medicine

## 2019-11-14 MED ORDER — TRIAMTERENE-HCTZ 37.5-25 MG PO CAPS: ORAL_CAPSULE | ORAL | 1 refills | Status: DC

## 2019-11-14 NOTE — Telephone Encounter (Signed)
[facility] requesting refill on Triam/hctz 37.5-25 mg capsule. Take one capsule po once daily in the morning. Pt last seen 09/04/19 for wellness. Please advise. Thank you

## 2019-11-14 NOTE — Addendum Note (Signed)
Addended by: Erven Colla on: 11/14/2019 04:36 PM   Modules accepted: Orders

## 2019-12-04 ENCOUNTER — Telehealth (INDEPENDENT_AMBULATORY_CARE_PROVIDER_SITE_OTHER): Payer: Self-pay | Admitting: *Deleted

## 2019-12-04 ENCOUNTER — Encounter (INDEPENDENT_AMBULATORY_CARE_PROVIDER_SITE_OTHER): Payer: Self-pay | Admitting: *Deleted

## 2019-12-04 NOTE — Telephone Encounter (Signed)
Patient needs Plenvu (copay card) ° °

## 2019-12-05 MED ORDER — PLENVU 140 G PO SOLR
1.0000 | Freq: Once | ORAL | 0 refills | Status: AC
Start: 2019-12-05 — End: 2019-12-05

## 2019-12-24 ENCOUNTER — Telehealth (INDEPENDENT_AMBULATORY_CARE_PROVIDER_SITE_OTHER): Payer: Self-pay | Admitting: *Deleted

## 2019-12-24 NOTE — Telephone Encounter (Signed)
Referring MD/PCP: malen atylor   Procedure: tcs  Reason/Indication:  screening  Has patient had this procedure before?  Yes, years ago  If so, when, by whom and where?    Is there a family history of colon cancer?  no  Who?  What age when diagnosed?    Is patient diabetic?   no      Does patient have prosthetic heart valve or mechanical valve?  no  Do you have a pacemaker/defibrillator?  no  Has patient ever had endocarditis/atrial fibrillation? no  Does patient use oxygen? no  Has patient had joint replacement within last 12 months?  no  Is patient constipated or do they take laxatives? no  Does patient have a history of alcohol/drug use?  no  Is patient on blood thinner such as Coumadin, Plavix and/or Aspirin? no  Medications: flovent inhaler, triam/hctz 37.5/25 mg daily, sertraline 50 mg daily, biotin daily, osteo biflex daily, vit d daily, tylenol arthritis daily  Allergies: sulfur drugs, most pain meds  Medication Adjustment per Dr Rehman/Dr Jenetta Downer   Procedure date & time: 01/24/20

## 2020-01-08 ENCOUNTER — Encounter (INDEPENDENT_AMBULATORY_CARE_PROVIDER_SITE_OTHER): Payer: Self-pay | Admitting: *Deleted

## 2020-01-22 ENCOUNTER — Other Ambulatory Visit (HOSPITAL_COMMUNITY): Payer: Medicare PPO

## 2020-02-12 ENCOUNTER — Other Ambulatory Visit: Payer: Self-pay

## 2020-02-12 ENCOUNTER — Encounter (INDEPENDENT_AMBULATORY_CARE_PROVIDER_SITE_OTHER): Payer: Medicare PPO | Admitting: Ophthalmology

## 2020-02-12 DIAGNOSIS — H43813 Vitreous degeneration, bilateral: Secondary | ICD-10-CM | POA: Diagnosis not present

## 2020-02-12 DIAGNOSIS — H353132 Nonexudative age-related macular degeneration, bilateral, intermediate dry stage: Secondary | ICD-10-CM | POA: Diagnosis not present

## 2020-02-12 DIAGNOSIS — D3132 Benign neoplasm of left choroid: Secondary | ICD-10-CM | POA: Diagnosis not present

## 2020-03-04 ENCOUNTER — Telehealth (INDEPENDENT_AMBULATORY_CARE_PROVIDER_SITE_OTHER): Payer: Self-pay | Admitting: *Deleted

## 2020-03-04 NOTE — Telephone Encounter (Signed)
Referring MD/PCP: malen atylor   Procedure: tcs  Reason/Indication:  screening  Has patient had this procedure before?  Yes, years ago             If so, when, by whom and where?    Is there a family history of colon cancer?  no             Who?  What age when diagnosed?    Is patient diabetic?   no                                                  Does patient have prosthetic heart valve or mechanical valve?  no  Do you have a pacemaker/defibrillator?  no  Has patient ever had endocarditis/atrial fibrillation? no  Does patient use oxygen? no  Has patient had joint replacement within last 12 months?  no  Is patient constipated or do they take laxatives? no  Does patient have a history of alcohol/drug use?  no  Is patient on blood thinner such as Coumadin, Plavix and/or Aspirin? no  Medications: flovent inhaler, triam/hctz 37.5/25 mg daily, sertraline 50 mg daily, biotin daily, osteo biflex daily, vit d daily, tylenol arthritis daily  Allergies: sulfur drugs, most pain meds  Medication Adjustment per Dr Rehman/Dr Jenetta Downer   Procedure date & time: 04/03/20

## 2020-04-01 ENCOUNTER — Other Ambulatory Visit: Payer: Self-pay

## 2020-04-01 ENCOUNTER — Other Ambulatory Visit (HOSPITAL_COMMUNITY)
Admission: RE | Admit: 2020-04-01 | Discharge: 2020-04-01 | Disposition: A | Discharge status: Home / Self Care | Payer: Medicare PPO | Source: Ambulatory Visit | Attending: Internal Medicine | Admitting: Internal Medicine

## 2020-04-01 DIAGNOSIS — Z01812 Encounter for preprocedural laboratory examination: Secondary | ICD-10-CM | POA: Insufficient documentation

## 2020-04-01 DIAGNOSIS — Z20822 Contact with and (suspected) exposure to covid-19: Secondary | ICD-10-CM | POA: Insufficient documentation

## 2020-04-01 LAB — SARS CORONAVIRUS 2 (TAT 6-24 HRS): SARS Coronavirus 2: NEGATIVE

## 2020-04-03 ENCOUNTER — Other Ambulatory Visit: Payer: Self-pay

## 2020-04-03 ENCOUNTER — Encounter (HOSPITAL_COMMUNITY): Payer: Self-pay | Admitting: Internal Medicine

## 2020-04-03 ENCOUNTER — Ambulatory Visit (HOSPITAL_COMMUNITY)
Admission: RE | Admit: 2020-04-03 | Discharge: 2020-04-03 | Disposition: A | Discharge status: Home / Self Care | Payer: Medicare PPO | Attending: Internal Medicine | Admitting: Internal Medicine

## 2020-04-03 ENCOUNTER — Encounter (HOSPITAL_COMMUNITY)
Admission: RE | Disposition: A | Discharge status: Home / Self Care | Payer: Self-pay | Source: Home / Self Care | Attending: Internal Medicine

## 2020-04-03 DIAGNOSIS — Z7951 Long term (current) use of inhaled steroids: Secondary | ICD-10-CM | POA: Diagnosis not present

## 2020-04-03 DIAGNOSIS — Z1211 Encounter for screening for malignant neoplasm of colon: Secondary | ICD-10-CM | POA: Diagnosis not present

## 2020-04-03 DIAGNOSIS — Q2730 Arteriovenous malformation, site unspecified: Secondary | ICD-10-CM | POA: Diagnosis not present

## 2020-04-03 DIAGNOSIS — Z79899 Other long term (current) drug therapy: Secondary | ICD-10-CM | POA: Diagnosis not present

## 2020-04-03 DIAGNOSIS — D123 Benign neoplasm of transverse colon: Secondary | ICD-10-CM | POA: Insufficient documentation

## 2020-04-03 DIAGNOSIS — K644 Residual hemorrhoidal skin tags: Secondary | ICD-10-CM | POA: Insufficient documentation

## 2020-04-03 DIAGNOSIS — D120 Benign neoplasm of cecum: Secondary | ICD-10-CM | POA: Insufficient documentation

## 2020-04-03 HISTORY — PX: COLONOSCOPY: SHX5424

## 2020-04-03 HISTORY — PX: POLYPECTOMY: SHX149

## 2020-04-03 LAB — HM COLONOSCOPY

## 2020-04-03 SURGERY — COLONOSCOPY
Anesthesia: Moderate Sedation

## 2020-04-03 MED ORDER — MEPERIDINE HCL 50 MG/ML IJ SOLN
INTRAMUSCULAR | Status: AC
  Filled 2020-04-03: qty 1

## 2020-04-03 MED ORDER — SODIUM CHLORIDE 0.9 % IV SOLN: INTRAVENOUS | Status: DC

## 2020-04-03 MED ORDER — MIDAZOLAM HCL 5 MG/5ML IJ SOLN
INTRAMUSCULAR | Status: DC | PRN
  Administered 2020-04-03: 2 mg via INTRAVENOUS
  Administered 2020-04-03 (×3): 1 mg via INTRAVENOUS

## 2020-04-03 MED ORDER — FENTANYL CITRATE (PF) 100 MCG/2ML IJ SOLN
INTRAMUSCULAR | Status: AC
  Filled 2020-04-03: qty 2

## 2020-04-03 MED ORDER — FENTANYL CITRATE (PF) 100 MCG/2ML IJ SOLN
INTRAMUSCULAR | Status: DC | PRN
Start: 2020-04-03 — End: 2020-04-03
  Administered 2020-04-03 (×2): 25 ug via INTRAVENOUS

## 2020-04-03 MED ORDER — STERILE WATER FOR IRRIGATION IR SOLN
Status: DC | PRN
  Administered 2020-04-03: 100 mL

## 2020-04-03 MED ORDER — MIDAZOLAM HCL 5 MG/5ML IJ SOLN
INTRAMUSCULAR | Status: AC
  Filled 2020-04-03: qty 10

## 2020-04-03 NOTE — Discharge Instructions (Signed)
No aspirin or NSAIDs for 24 hours. Resume usual medications and diet as before. No driving for 24 hours. Physician will call with biopsy results.  PATIENT INSTRUCTIONS POST-ANESTHESIA  IMMEDIATELY FOLLOWING SURGERY:  Do not drive or operate machinery for the first twenty four hours after surgery.  Do not make any important decisions for twenty four hours after surgery or while taking narcotic pain medications or sedatives.  If you develop intractable nausea and vomiting or a severe headache please notify your doctor immediately.  FOLLOW-UP:  Please make an appointment with your surgeon as instructed. You do not need to follow up with anesthesia unless specifically instructed to do so.  WOUND CARE INSTRUCTIONS (if applicable):  Keep a dry clean dressing on the anesthesia/puncture wound site if there is drainage.  Once the wound has quit draining you may leave it open to air.  Generally you should leave the bandage intact for twenty four hours unless there is drainage.  If the epidural site drains for more than 36-48 hours please call the anesthesia department.  QUESTIONS?:  Please feel free to call your physician or the hospital operator if you have any questions, and they will be happy to assist you.      Colonoscopy, Adult, Care After This sheet gives you information about how to care for yourself after your procedure. Your doctor may also give you more specific instructions. If you have problems or questions, call your doctor. What can I expect after the procedure? After the procedure, it is common to have:  A small amount of blood in your poop (stool) for 24 hours.  Some gas.  Mild cramping or bloating in your belly (abdomen). Follow these instructions at home: Eating and drinking   Drink enough fluid to keep your pee (urine) pale yellow.  Follow instructions from your doctor about what you cannot eat or drink.  Return to your normal diet as told by your doctor. Avoid heavy or  fried foods that are hard to digest. Activity  Rest as told by your doctor.  Do not sit for a long time without moving. Get up to take short walks every 1-2 hours. This is important. Ask for help if you feel weak or unsteady.  Return to your normal activities as told by your doctor. Ask your doctor what activities are safe for you. To help cramping and bloating:   Try walking around.  Put heat on your belly as told by your doctor. Use the heat source that your doctor recommends, such as a moist heat pack or a heating pad. ? Put a towel between your skin and the heat source. ? Leave the heat on for 20-30 minutes. ? Remove the heat if your skin turns bright red. This is very important if you are unable to feel pain, heat, or cold. You may have a greater risk of getting burned. General instructions  For the first 24 hours after the procedure: ? Do not drive or use machinery. ? Do not sign important documents. ? Do not drink alcohol. ? Do your daily activities more slowly than normal. ? Eat foods that are soft and easy to digest.  Take over-the-counter or prescription medicines only as told by your doctor.  Keep all follow-up visits as told by your doctor. This is important. Contact a doctor if:  You have blood in your poop 2-3 days after the procedure. Get help right away if:  You have more than a small amount of blood in your poop.  You see large clumps of tissue (blood clots) in your poop.  Your belly is swollen.  You feel like you may vomit (nauseous).  You vomit.  You have a fever.  You have belly pain that gets worse, and medicine does not help your pain. Summary  After the procedure, it is common to have a small amount of blood in your poop. You may also have mild cramping and bloating in your belly.  For the first 24 hours after the procedure, do not drive or use machinery, do not sign important documents, and do not drink alcohol.  Get help right away if you  have a lot of blood in your poop, feel like you may vomit, have a fever, or have more belly pain. This information is not intended to replace advice given to you by your health care provider. Make sure you discuss any questions you have with your health care provider. Document Revised: 11/13/2018 Document Reviewed: 11/13/2018 Elsevier Patient Education  [place].    Colon Polyps  Polyps are tissue growths inside the body. Polyps can grow in many places, including the large intestine (colon). A polyp may be a round bump or a mushroom-shaped growth. You could have one polyp or several. Most colon polyps are noncancerous (benign). However, some colon polyps can become cancerous over time. Finding and removing the polyps early can help prevent this. What are the causes? The exact cause of colon polyps is not known. What increases the risk? You are more likely to develop this condition if you:  Have a family history of colon cancer or colon polyps.  Are older than 69 or older than 45 if you are African American.  Have inflammatory bowel disease, such as ulcerative colitis or Crohn's disease.  Have certain hereditary conditions, such as: ? Familial adenomatous polyposis. ? Lynch syndrome. ? Turcot syndrome. ? Peutz-Jeghers syndrome.  Are overweight.  Smoke cigarettes.  Do not get enough exercise.  Drink too much alcohol.  Eat a diet that is high in fat and red meat and low in fiber.  Had childhood cancer that was treated with abdominal radiation. What are the signs or symptoms? Most polyps do not cause symptoms. If you have symptoms, they may include:  Blood coming from your rectum when having a bowel movement.  Blood in your stool. The stool may look dark red or black.  Abdominal pain.  A change in bowel habits, such as constipation or diarrhea. How is this diagnosed? This condition is diagnosed with a colonoscopy. This is a procedure in which a lighted,  flexible scope is inserted into the anus and then passed into the colon to examine the area. Polyps are sometimes found when a colonoscopy is done as part of routine cancer screening tests. How is this treated? Treatment for this condition involves removing any polyps that are found. Most polyps can be removed during a colonoscopy. Those polyps will then be tested for cancer. Additional treatment may be needed depending on the results of testing. Follow these instructions at home: Lifestyle  Maintain a healthy weight, or lose weight if recommended by your health care provider.  Exercise every day or as told by your health care provider.  Do not use any products that contain nicotine or tobacco, such as cigarettes and e-cigarettes. If you need help quitting, ask your health care provider.  If you drink alcohol, limit how much you have: ? 0-1 drink a day for women. ? 0-2 drinks a day for  men.  Be aware of how much alcohol is in your drink. In the [place] one drink equals one 12 oz bottle of beer (355 mL), one 5 oz glass of wine (148 mL), or one 1 oz shot of hard liquor (44 mL). Eating and drinking   Eat foods that are high in fiber, such as fruits, vegetables, and whole grains.  Eat foods that are high in calcium and vitamin D, such as milk, cheese, yogurt, eggs, liver, fish, and broccoli.  Limit foods that are high in fat, such as fried foods and desserts.  Limit the amount of red meat and processed meat you eat, such as hot dogs, sausage, bacon, and lunch meats. General instructions  Keep all follow-up visits as told by your health care provider. This is important. ? This includes having regularly scheduled colonoscopies. ? Talk to your health care provider about when you need a colonoscopy. Contact a health care provider if:  You have new or worsening bleeding during a bowel movement.  You have new or increased blood in your stool.  You have a change in bowel habits.  You lose  weight for no known reason. Summary  Polyps are tissue growths inside the body. Polyps can grow in many places, including the colon.  Most colon polyps are noncancerous (benign), but some can become cancerous over time.  This condition is diagnosed with a colonoscopy.  Treatment for this condition involves removing any polyps that are found. Most polyps can be removed during a colonoscopy. This information is not intended to replace advice given to you by your health care provider. Make sure you discuss any questions you have with your health care provider. Document Revised: 08/04/2017 Document Reviewed: 08/04/2017 Elsevier Patient Education  [place].   Hemorrhoids Hemorrhoids are swollen veins that may develop:  In the butt (rectum). These are called internal hemorrhoids.  Around the opening of the butt (anus). These are called external hemorrhoids. Hemorrhoids can cause pain, itching, or bleeding. Most of the time, they do not cause serious problems. They usually get better with diet changes, lifestyle changes, and other home treatments. What are the causes? This condition may be caused by:  Having trouble pooping (constipation).  Pushing hard (straining) to poop.  Watery poop (diarrhea).  Pregnancy.  Being very overweight (obese).  Sitting for long periods of time.  Heavy lifting or other activity that causes you to strain.  Anal sex.  Riding a bike for a long period of time. What are the signs or symptoms? Symptoms of this condition include:  Pain.  Itching or soreness in the butt.  Bleeding from the butt.  Leaking poop.  Swelling in the area.  One or more lumps around the opening of your butt. How is this diagnosed? A doctor can often diagnose this condition by looking at the affected area. The doctor may also:  Do an exam that involves feeling the area with a gloved hand (digital rectal exam).  Examine the area inside your butt using a  small tube (anoscope).  Order blood tests. This may be done if you have lost a lot of blood.  Have you get a test that involves looking inside the colon using a flexible tube with a camera on the end (sigmoidoscopy or colonoscopy). How is this treated? This condition can usually be treated at home. Your doctor may tell you to change what you eat, make lifestyle changes, or try home treatments. If these do not help, procedures can be  done to remove the hemorrhoids or make them smaller. These may involve:  Placing rubber bands at the base of the hemorrhoids to cut off their blood supply.  Injecting medicine into the hemorrhoids to shrink them.  Shining a type of light energy onto the hemorrhoids to cause them to fall off.  Doing surgery to remove the hemorrhoids or cut off their blood supply. Follow these instructions at home: Eating and drinking   Eat foods that have a lot of fiber in them. These include whole grains, beans, nuts, fruits, and vegetables.  Ask your doctor about taking products that have added fiber (fibersupplements).  Reduce the amount of fat in your diet. You can do this by: ? Eating low-fat dairy products. ? Eating less red meat. ? Avoiding processed foods.  Drink enough fluid to keep your pee (urine) pale yellow. Managing pain and swelling   Take a warm-water bath (sitz bath) for 20 minutes to ease pain. Do this 3-4 times a day. You may do this in a bathtub or using a portable sitz bath that fits over the toilet.  If told, put ice on the painful area. It may be helpful to use ice between your warm baths. ? Put ice in a plastic bag. ? Place a towel between your skin and the bag. ? Leave the ice on for 20 minutes, 2-3 times a day. General instructions  Take over-the-counter and prescription medicines only as told by your doctor. ? Medicated creams and medicines may be used as told.  Exercise often. Ask your doctor how much and what kind of exercise is best  for you.  Go to the bathroom when you have the urge to poop. Do not wait.  Avoid pushing too hard when you poop.  Keep your butt dry and clean. Use wet toilet paper or moist towelettes after pooping.  Do not sit on the toilet for a long time.  Keep all follow-up visits as told by your doctor. This is important. Contact a doctor if you:  Have pain and swelling that do not get better with treatment or medicine.  Have trouble pooping.  Cannot poop.  Have pain or swelling outside the area of the hemorrhoids. Get help right away if you have:  Bleeding that will not stop. Summary  Hemorrhoids are swollen veins in the butt or around the opening of the butt.  They can cause pain, itching, or bleeding.  Eat foods that have a lot of fiber in them. These include whole grains, beans, nuts, fruits, and vegetables.  Take a warm-water bath (sitz bath) for 20 minutes to ease pain. Do this 3-4 times a day. This information is not intended to replace advice given to you by your health care provider. Make sure you discuss any questions you have with your health care provider. Document Revised: 04/27/2018 Document Reviewed: 09/08/2017 Elsevier Patient Education  [place].

## 2020-04-03 NOTE — Op Note (Addendum)
[facility] Patient Name: Ariel Cook Procedure Date: 04/03/2020 8:23 AM MRN: [id number] Date of Birth: [birth year] Attending MD: Hildred Laser , MD CSN: [id number] Age: 74 Admit Type: Outpatient Procedure:                Colonoscopy Indications:              Screening for colorectal malignant neoplasm Providers:                Hildred Laser, MD, Crystal Page, Lambert Mody,                            Aram Candela Referring MD:             Elvia Collum, DO Medicines:                Fentanyl 50 micrograms IV, Midazolam 5 mg IV Complications:            No immediate complications. Estimated Blood Loss:     Estimated blood loss was minimal. Procedure:                Pre-Anesthesia Assessment:                           - Prior to the procedure, a History and Physical                            was performed, and patient medications and                            allergies were reviewed. The patient's tolerance of                            previous anesthesia was also reviewed. The risks                            and benefits of the procedure and the sedation                            options and risks were discussed with the patient.                            All questions were answered, and informed consent                            was obtained. Prior Anticoagulants: The patient has                            taken no previous anticoagulant or antiplatelet                            agents. ASA Grade Assessment: II - A patient with                            mild systemic disease. After reviewing the risks  and benefits, the patient was deemed in                            satisfactory condition to undergo the procedure.                           After obtaining informed consent, the colonoscope                            was passed under direct vision. Throughout the                            procedure, the patient's blood pressure, pulse, and                             oxygen saturations were monitored continuously. The                            PCF-[id number] ([id number]) was introduced through the                            anus and advanced to the the cecum, identified by                            appendiceal orifice and ileocecal valve. The                            colonoscopy was performed without difficulty. The                            patient tolerated the procedure well. The quality                            of the bowel preparation was excellent. The                            ileocecal valve, appendiceal orifice, and rectum                            were photographed. Scope In: 8:48:53 AM Scope Out: 9:07:16 AM Scope Withdrawal Time: 0 hours 11 minutes 14 seconds  Total Procedure Duration: 0 hours 18 minutes 23 seconds  Findings:      The perianal and digital rectal examinations were normal.      Two polyps were found in the splenic flexure and cecum. The polyps were       small in size. These polyps were removed with a cold snare. Resection       and retrieval were complete. The pathology specimen was placed into       Bottle Number 1.      The exam was otherwise normal throughout the examined colon.      External hemorrhoids were found during retroflexion. The hemorrhoids       were small.      Small cecal AVM. Impression:               -  Two small polyps at the splenic flexure and in                            the cecum, removed with a cold snare. Resected and                            retrieved.                           - Small cecal arteriovenous malformation                           - External hemorrhoids. Moderate Sedation:      Moderate (conscious) sedation was administered by the endoscopy nurse       and supervised by the endoscopist. The following parameters were       monitored: oxygen saturation, heart rate, blood pressure, CO2       capnography and response to care. Total physician intraservice  time was       22 minutes. Recommendation:           - Patient has a contact number available for                            emergencies. The signs and symptoms of potential                            delayed complications were discussed with the                            patient. Return to normal activities tomorrow.                            Written discharge instructions were provided to the                            patient.                           - Resume previous diet today.                           - Continue present medications.                           - No aspirin, ibuprofen, naproxen, or other                            non-steroidal anti-inflammatory drugs for 1 day.                           - Await pathology results.                           - Repeat colonoscopy is recommended. The  colonoscopy date will be determined after pathology                            results from today's exam become available for                            review. Procedure Code(s):        --- Professional ---                           [id number], Colonoscopy, flexible; with removal of                            tumor(s), polyp(s), or other lesion(s) by snare                            technique                           G0500, Moderate sedation services provided by the                            same physician or other qualified health care                            professional performing a gastrointestinal                            endoscopic service that sedation supports,                            requiring the presence of an independent trained                            observer to assist in the monitoring of the                            patient's level of consciousness and physiological                            status; initial 15 minutes of intra-service time;                            patient age 29 years or older (additional time may                             be reported with [phone], as appropriate) Diagnosis Code(s):        --- Professional ---                           Z12.11, Encounter for screening for malignant                            neoplasm of colon  K63.5, Polyp of colon                           K64.4, Residual hemorrhoidal skin tags CPT copyright 2019 American Medical Association. All rights reserved. The codes documented in this report are preliminary and upon coder review may  be revised to meet current compliance requirements. Hildred Laser, MD Hildred Laser, MD 04/03/2020 9:16:14 AM This report has been signed electronically. Number of Addenda: 0

## 2020-04-03 NOTE — H&P (Signed)
Ariel Cook is an 74 y.o. female.   Chief Complaint: Patient is here for colonoscopy. HPI: Patient is 74 year old Caucasian female who is here for screening colonoscopy.  Last exam was in May 2007 and was normal.  She denies abdominal pain change in bowel habits or rectal bleeding.   Family history is negative for CRC. She does not take NSAIDs or aspirin.  Past Medical History:  Diagnosis Date  . Asthma    USES ALBUTEROL INHALER  . DDD (degenerative disc disease), cervical   . Headache(784.0)   . Left carpal tunnel syndrome 09/03/2011  . PONV (postoperative nausea and vomiting)   . Sciatica   . Unspecified adverse effect of anesthesia    CAUSES ASTHMA TO EXACERBATE, CAUSES MIGRAINES    Past Surgical History:  Procedure Laterality Date  . APPENDECTOMY  2010  . CARPAL TUNNEL RELEASE  2010   RT  . CARPAL TUNNEL RELEASE  09/03/2011   Procedure: CARPAL TUNNEL RELEASE;  Surgeon: Johnny Bridge, MD;  Location: [facility];  Service: Orthopedics;  Laterality: Left;  . EYE SURGERY  2011   BIL CATARACT  . TUBAL LIGATION  1976    Family History  Problem Relation Age of Onset  . Hypertension Mother   . Heart attack Father   . Breast cancer Sister    Social History:  reports that she has never smoked. She has never used smokeless tobacco. She reports that she does not drink alcohol and does not use drugs.  Allergies:  Allergies  Allergen Reactions  . Sulfa Antibiotics Anaphylaxis  . Other     Pain meds (cannot tolerate NARCOTICS)   . Stadol [Butorphanol]     Vomiting and excessive drowsiness  . Codeine Palpitations    Medications Prior to Admission  Medication Sig Dispense Refill  . acetaminophen (TYLENOL) 650 MG CR tablet Take 650 mg by mouth at bedtime.     Marland Kitchen albuterol (VENTOLIN HFA) 108 (90 Base) MCG/ACT inhaler Inhale 2 puffs into the lungs every 6 (six) hours as needed for wheezing or shortness of breath. 18 g 5  . Biotin 10000 MCG TABS Take 10,000 mcg by  mouth daily.     . fluticasone (FLOVENT HFA) 110 MCG/ACT inhaler Inhale 2 puffs into the lungs 2 (two) times daily. 1 Inhaler 5  . Misc Natural Products (OSTEO BI-FLEX JOINT SHIELD PO) Take 2 tablets by mouth daily. Triple Strength with Turmeric    . Multiple Vitamins-Minerals (ICAPS AREDS 2 PO) Take 1 tablet by mouth daily.     . Polyvinyl Alcohol-Povidone PF (REFRESH) 1.4-0.6 % SOLN Place 1 drop into both eyes daily as needed (Dry eye).    Marland Kitchen triamterene-hydrochlorothiazide (DYAZIDE) 37.5-25 MG capsule Take 1 capsule by mouth once daily in the morning 90 capsule 1  . nystatin (MYCOSTATIN) 100000 UNIT/ML suspension Take 5 mLs (500,000 Units total) by mouth 4 (four) times daily. (Patient not taking: Reported on 03/28/2020) 60 mL 0  . sertraline (ZOLOFT) 50 MG tablet Take 1 tablet (50 mg total) by mouth daily. (Patient not taking: Reported on 03/28/2020) 30 tablet 3    No results found for this or any previous visit (from the past 48 hour(s)). No results found.  Review of Systems  Blood pressure 112/86, pulse 89, temperature 98.2 F (36.8 C), temperature source Oral, resp. rate 12, height 4\' 11"  (1.499 m), weight 65.3 kg, SpO2 98 %. Physical Exam HENT:     Mouth/Throat:     Mouth: Mucous membranes are  moist.     Pharynx: Oropharynx is clear.  Eyes:     General: No scleral icterus.    Conjunctiva/sclera: Conjunctivae normal.  Cardiovascular:     Rate and Rhythm: Normal rate and regular rhythm.     Heart sounds: Normal heart sounds. No murmur heard.   Pulmonary:     Effort: Pulmonary effort is normal.     Breath sounds: Normal breath sounds.  Abdominal:     Comments: Abdomen is symmetrical.  Appendectomy scar noted.  She has mild generalized tenderness in all 4 quadrants.  Patient says she does has very sensitive skin.  No organomegaly or masses.  Musculoskeletal:        General: No swelling.     Cervical back: Neck supple.  Lymphadenopathy:     Cervical: No cervical adenopathy.   Skin:    General: Skin is warm and dry.  Neurological:     Mental Status: She is alert.      Assessment/Plan  Average risk screening colonoscopy.  Hildred Laser, MD 04/03/2020, 8:40 AM

## 2020-04-04 LAB — SURGICAL PATHOLOGY

## 2020-04-06 ENCOUNTER — Encounter: Payer: Self-pay | Admitting: Family Medicine

## 2020-04-06 DIAGNOSIS — D126 Benign neoplasm of colon, unspecified: Secondary | ICD-10-CM | POA: Insufficient documentation

## 2020-04-09 ENCOUNTER — Encounter (HOSPITAL_COMMUNITY): Payer: Self-pay | Admitting: Internal Medicine

## 2020-04-23 ENCOUNTER — Encounter (INDEPENDENT_AMBULATORY_CARE_PROVIDER_SITE_OTHER): Payer: Self-pay | Admitting: *Deleted

## 2020-07-21 ENCOUNTER — Telehealth: Payer: Self-pay | Admitting: Family Medicine

## 2020-07-21 NOTE — Telephone Encounter (Signed)
Form is done. Thx. Dr. Darene Lamer

## 2020-07-21 NOTE — Telephone Encounter (Signed)
Handicap place card form given to Ariel Cook. [name] needing reason for place card. Pt states her son is a quadriplegic and she has back pain. Please advise. Thank you.  (Pt would like form mailed to her)

## 2020-07-22 NOTE — Telephone Encounter (Signed)
Form placed in mail and mailed to patient.

## 2020-07-30 ENCOUNTER — Other Ambulatory Visit: Payer: Self-pay | Admitting: Family Medicine

## 2020-07-30 DIAGNOSIS — Z79899 Other long term (current) drug therapy: Secondary | ICD-10-CM

## 2020-07-30 DIAGNOSIS — Z13220 Encounter for screening for lipoid disorders: Secondary | ICD-10-CM

## 2020-07-31 NOTE — Telephone Encounter (Signed)
Needs f/u appt in May for her blood pressure. Dr. Lovena Le Needs labs ordered also.

## 2020-07-31 NOTE — Telephone Encounter (Signed)
Lab Results  Component Value Date   HGBA1C 5.8 (H) 08/30/2019    Lab Results  Component Value Date   CREATININE 1.02 (H) 08/30/2019     Lab Results  Component Value Date   CHOL 215 (H) 08/30/2019   HDL 80 08/30/2019   LDLCALC 117 (H) 08/30/2019   TRIG 105 08/30/2019   CHOLHDL 2.7 08/30/2019     BP Readings from Last 3 Encounters:  04/03/20 (!) 106/55  09/04/19 130/78  08/24/19 (!) 152/84

## 2020-08-01 NOTE — Telephone Encounter (Signed)
Which labs will patient need? Please advise. Thank you

## 2020-08-01 NOTE — Addendum Note (Signed)
Addended by: Vicente Males on: 08/01/2020 10:26 AM   Modules accepted: Orders

## 2020-08-01 NOTE — Telephone Encounter (Signed)
Lab orders placed and mailed to patient with note to schedule appt.

## 2020-08-25 ENCOUNTER — Telehealth: Payer: Self-pay | Admitting: Family Medicine

## 2020-08-25 NOTE — Telephone Encounter (Signed)
Left message for patient to schedule Annual Wellness Visit.  Please schedule with Nurse Health Advisor Shannon Crews, RN at [facility]  

## 2020-09-15 DIAGNOSIS — M5441 Lumbago with sciatica, right side: Secondary | ICD-10-CM | POA: Diagnosis not present

## 2020-09-15 DIAGNOSIS — M47816 Spondylosis without myelopathy or radiculopathy, lumbar region: Secondary | ICD-10-CM | POA: Diagnosis not present

## 2020-09-15 DIAGNOSIS — Z79899 Other long term (current) drug therapy: Secondary | ICD-10-CM | POA: Diagnosis not present

## 2020-09-15 DIAGNOSIS — Z13220 Encounter for screening for lipoid disorders: Secondary | ICD-10-CM | POA: Diagnosis not present

## 2020-09-16 LAB — LIPID PANEL
Chol/HDL Ratio: 2.8 ratio (ref 0.0–4.4)
Cholesterol, Total: 221 mg/dL — ABNORMAL HIGH (ref 100–199)
HDL: 79 mg/dL (ref >39)
LDL Chol Calc (NIH): 125 mg/dL — ABNORMAL HIGH (ref 0–99)
Triglycerides: 99 mg/dL (ref 0–149)
VLDL Cholesterol Cal: 17 mg/dL (ref 5–40)

## 2020-09-16 LAB — CBC WITH DIFFERENTIAL/PLATELET
Basophils Absolute: 0 10*3/uL (ref 0.0–0.2)
Basos: 0 %
EOS (ABSOLUTE): 0.1 10*3/uL (ref 0.0–0.4)
Eos: 1 %
Hematocrit: 39.3 % (ref 34.0–46.6)
Hemoglobin: 13.2 g/dL (ref 11.1–15.9)
Immature Grans (Abs): 0 10*3/uL (ref 0.0–0.1)
Immature Granulocytes: 0 %
Lymphocytes Absolute: 2.2 10*3/uL (ref 0.7–3.1)
Lymphs: 43 %
MCH: 30.8 pg (ref 26.6–33.0)
MCHC: 33.6 g/dL (ref 31.5–35.7)
MCV: 92 fL (ref 79–97)
Monocytes Absolute: 0.5 10*3/uL (ref 0.1–0.9)
Monocytes: 10 %
Neutrophils Absolute: 2.2 10*3/uL (ref 1.4–7.0)
Neutrophils: 46 %
Platelets: 264 10*3/uL (ref 150–450)
RBC: 4.28 x10E6/uL (ref 3.77–5.28)
RDW: 12.1 % (ref 11.7–15.4)
WBC: 5 10*3/uL (ref 3.4–10.8)

## 2020-09-16 LAB — COMPREHENSIVE METABOLIC PANEL
ALT: 10 IU/L (ref 0–32)
AST: 17 IU/L (ref 0–40)
Albumin/Globulin Ratio: 1.8 (ref 1.2–2.2)
Albumin: 4.2 g/dL (ref 3.7–4.7)
Alkaline Phosphatase: 97 IU/L (ref 44–121)
BUN/Creatinine Ratio: 13 (ref 12–28)
BUN: 11 mg/dL (ref 8–27)
Bilirubin Total: 0.4 mg/dL (ref 0.0–1.2)
CO2: 25 mmol/L (ref 20–29)
Calcium: 9.5 mg/dL (ref 8.7–10.3)
Chloride: 103 mmol/L (ref 96–106)
Creatinine, Ser: 0.82 mg/dL (ref 0.57–1.00)
Globulin, Total: 2.3 g/dL (ref 1.5–4.5)
Glucose: 89 mg/dL (ref 65–99)
Potassium: 4.3 mmol/L (ref 3.5–5.2)
Sodium: 142 mmol/L (ref 134–144)
Total Protein: 6.5 g/dL (ref 6.0–8.5)
eGFR: 75 mL/min/{1.73_m2} (ref >59)

## 2020-10-07 ENCOUNTER — Other Ambulatory Visit: Payer: Self-pay

## 2020-10-07 ENCOUNTER — Ambulatory Visit (INDEPENDENT_AMBULATORY_CARE_PROVIDER_SITE_OTHER): Payer: Medicare PPO | Admitting: Family Medicine

## 2020-10-07 ENCOUNTER — Encounter: Payer: Self-pay | Admitting: Family Medicine

## 2020-10-07 VITALS — BP 122/78 | HR 75 | Temp 98.1°F | Ht 59.0 in | Wt 155.0 lb

## 2020-10-07 DIAGNOSIS — Z0001 Encounter for general adult medical examination with abnormal findings: Secondary | ICD-10-CM

## 2020-10-07 DIAGNOSIS — F321 Major depressive disorder, single episode, moderate: Secondary | ICD-10-CM

## 2020-10-07 DIAGNOSIS — Z1231 Encounter for screening mammogram for malignant neoplasm of breast: Secondary | ICD-10-CM | POA: Diagnosis not present

## 2020-10-07 DIAGNOSIS — Z0000 Encounter for general adult medical examination without abnormal findings: Secondary | ICD-10-CM

## 2020-10-07 NOTE — Progress Notes (Signed)
Patient ID: Ariel Cook, female    DOB: Feb 24, 1946, 75 y.o.   MRN: [id number]   Chief Complaint  Patient presents with   Medicare Wellness   Subjective:    HPI AWV- Annual Wellness Visit  The patient was seen for their annual wellness visit. The patient's past medical history, surgical history, and family history were reviewed. Pertinent vaccines were reviewed ( tetanus, pneumonia, shingles, flu) The patient's medication list was reviewed and updated.  The height and weight were entered.  BMI recorded in electronic record elsewhere  Cognitive screening was completed. Outcome of Mini - Cog: pass   Falls /depression screening electronically recorded within record elsewhere  Current tobacco usage: none (All patients who use tobacco were given written and verbal information on quitting)  Recent listing of emergency department/hospitalizations over the past year were reviewed.  current specialist the patient sees on a regular basis: ortho - dr Layne Benton,    Medicare annual wellness visit patient questionnaire was reviewed.  A written screening schedule for the patient for the next 5-10 years was given. Appropriate discussion of followup regarding next visit was discussed.  Occ asthma giving her a "fit" for past 6 months.  Just came off prednisone for her back, Dr. Layne Benton, ortho. Not had covid in past year.   Taking triamterene- hctz - for fluid retention.  Seeing Dr. Layne Benton, ortho- helping with back pain and was just on prednisone for it Vegetarian.  Eating milk and cheese.   -seeing eye specialist- for injections in her eyes.  Has h/o depression, chronic- not wanting to take a medication for it. Relating to her son in group home and limited ability to see him during the pandemic.  Medical History Ariel Cook has a past medical history of Asthma, DDD (degenerative disc disease), cervical, Headache(784.0), Left carpal tunnel syndrome (09/03/2011), PONV (postoperative nausea  and vomiting), Sciatica, and Unspecified adverse effect of anesthesia.   Outpatient Encounter Medications as of 10/07/2020  Medication Sig   acetaminophen (TYLENOL) 650 MG CR tablet Take 650 mg by mouth at bedtime.   albuterol (VENTOLIN HFA) 108 (90 Base) MCG/ACT inhaler Inhale 2 puffs into the lungs every 6 (six) hours as needed for wheezing or shortness of breath.   Biotin 10000 MCG TABS Take 10,000 mcg by mouth daily.    fluticasone (FLOVENT HFA) 110 MCG/ACT inhaler Inhale 2 puffs into the lungs 2 (two) times daily.   Misc Natural Products (OSTEO BI-FLEX JOINT SHIELD PO) Take 2 tablets by mouth daily. Triple Strength with Turmeric   Multiple Vitamins-Minerals (ICAPS AREDS 2 PO) Take 1 tablet by mouth daily.    Polyvinyl Alcohol-Povidone PF (REFRESH) 1.4-0.6 % SOLN Place 1 drop into both eyes daily as needed (Dry eye).   triamterene-hydrochlorothiazide (DYAZIDE) 37.5-25 MG capsule Take 1 capsule by mouth once daily in the morning   No facility-administered encounter medications on file as of 10/07/2020.     Review of Systems  Constitutional:  Negative for chills and fever.  HENT:  Negative for congestion, rhinorrhea and sore throat.   Respiratory:  Negative for cough, shortness of breath and wheezing.   Cardiovascular:  Negative for chest pain and leg swelling.  Gastrointestinal:  Negative for abdominal pain, diarrhea, nausea and vomiting.  Genitourinary:  Negative for dysuria and frequency.  Musculoskeletal:  Negative for arthralgias and back pain.  Skin:  Negative for rash.  Neurological:  Negative for dizziness, weakness and headaches.    Vitals BP 122/78   Pulse 75   Temp 98.1  F (36.7 C)   Ht 4\' 11"  (1.499 m)   Wt 155 lb (70.3 kg)   SpO2 98%   BMI 31.31 kg/m   Objective:   Physical Exam Vitals and nursing note reviewed.  Constitutional:      Appearance: Normal appearance.  HENT:     Head: Normocephalic and atraumatic.     Nose: Nose normal.     Mouth/Throat:      Mouth: Mucous membranes are moist.     Pharynx: Oropharynx is clear.  Eyes:     Extraocular Movements: Extraocular movements intact.     Conjunctiva/sclera: Conjunctivae normal.     Pupils: Pupils are equal, round, and reactive to light.  Cardiovascular:     Rate and Rhythm: Normal rate and regular rhythm.     Pulses: Normal pulses.     Heart sounds: Normal heart sounds.  Pulmonary:     Effort: Pulmonary effort is normal.     Breath sounds: Normal breath sounds. No wheezing, rhonchi or rales.  Musculoskeletal:        General: Normal range of motion.     Right lower leg: No edema.     Left lower leg: No edema.  Skin:    General: Skin is warm and dry.     Findings: No lesion or rash.  Neurological:     General: No focal deficit present.     Mental Status: She is alert and oriented to person, place, and time.  Psychiatric:        Mood and Affect: Mood normal.        Behavior: Behavior normal.     Assessment and Plan   1. Medicare annual wellness visit, subsequent  2. Encounter for screening mammogram for breast cancer - MM 3D SCREEN BREAST BILATERAL  3. Current moderate episode of major depressive disorder without prior episode ([place])    Hm-Needs mammo order. Reviewed vaccines and pt can get tdap and shingrix at pharmacy.  Pt in agreement.  Depression- chronic due to her son in long term care facility.  Pt tried zoloft in past year.  Not wanting to take a medication or see counseling at this time.   Return in about 6 months (around 04/08/2021) for f/u lower leg edema.  10/19/2020

## 2020-10-17 ENCOUNTER — Ambulatory Visit (HOSPITAL_COMMUNITY)
Admission: RE | Admit: 2020-10-17 | Discharge: 2020-10-17 | Disposition: A | Discharge status: Home / Self Care | Payer: Medicare PPO | Source: Ambulatory Visit | Attending: Family Medicine | Admitting: Family Medicine

## 2020-10-17 ENCOUNTER — Other Ambulatory Visit: Payer: Self-pay

## 2020-10-17 DIAGNOSIS — Z1231 Encounter for screening mammogram for malignant neoplasm of breast: Secondary | ICD-10-CM | POA: Insufficient documentation

## 2020-10-23 ENCOUNTER — Telehealth: Payer: Self-pay | Admitting: *Deleted

## 2020-10-23 NOTE — Telephone Encounter (Signed)
FYI: Patient wanted you to know that her back went out again and Dr Layne Benton her Ortho is trying to set up up for back injections

## 2020-10-29 ENCOUNTER — Other Ambulatory Visit: Payer: Self-pay

## 2020-10-29 ENCOUNTER — Emergency Department (HOSPITAL_COMMUNITY)
Admission: EM | Admit: 2020-10-29 | Discharge: 2020-10-29 | Disposition: A | Discharge status: Home / Self Care | Payer: Medicare PPO | Attending: Emergency Medicine | Admitting: Emergency Medicine

## 2020-10-29 ENCOUNTER — Emergency Department (HOSPITAL_COMMUNITY): Payer: Medicare PPO

## 2020-10-29 ENCOUNTER — Encounter (HOSPITAL_COMMUNITY): Payer: Self-pay

## 2020-10-29 DIAGNOSIS — G8929 Other chronic pain: Secondary | ICD-10-CM | POA: Diagnosis not present

## 2020-10-29 DIAGNOSIS — M545 Low back pain, unspecified: Secondary | ICD-10-CM | POA: Insufficient documentation

## 2020-10-29 DIAGNOSIS — Z7951 Long term (current) use of inhaled steroids: Secondary | ICD-10-CM | POA: Diagnosis not present

## 2020-10-29 DIAGNOSIS — J45901 Unspecified asthma with (acute) exacerbation: Secondary | ICD-10-CM | POA: Insufficient documentation

## 2020-10-29 LAB — CBC WITH DIFFERENTIAL/PLATELET
Abs Immature Granulocytes: 0.02 10*3/uL (ref 0.00–0.07)
Basophils Absolute: 0 10*3/uL (ref 0.0–0.1)
Basophils Relative: 1 %
Eosinophils Absolute: 0 10*3/uL (ref 0.0–0.5)
Eosinophils Relative: 1 %
HCT: 37.8 % (ref 36.0–46.0)
Hemoglobin: 12.6 g/dL (ref 12.0–15.0)
Immature Granulocytes: 0 %
Lymphocytes Relative: 31 %
Lymphs Abs: 1.9 10*3/uL (ref 0.7–4.0)
MCH: 31.7 pg (ref 26.0–34.0)
MCHC: 33.3 g/dL (ref 30.0–36.0)
MCV: 95 fL (ref 80.0–100.0)
Monocytes Absolute: 0.5 10*3/uL (ref 0.1–1.0)
Monocytes Relative: 8 %
Neutro Abs: 3.6 10*3/uL (ref 1.7–7.7)
Neutrophils Relative %: 59 %
Platelets: 264 10*3/uL (ref 150–400)
RBC: 3.98 MIL/uL (ref 3.87–5.11)
RDW: 11.9 % (ref 11.5–15.5)
WBC: 6 10*3/uL (ref 4.0–10.5)
nRBC: 0 % (ref 0.0–0.2)

## 2020-10-29 LAB — URINALYSIS, ROUTINE W REFLEX MICROSCOPIC
Bilirubin Urine: NEGATIVE
Glucose, UA: NEGATIVE mg/dL
Hgb urine dipstick: NEGATIVE
Ketones, ur: NEGATIVE mg/dL
Leukocytes,Ua: NEGATIVE
Nitrite: NEGATIVE
Protein, ur: NEGATIVE mg/dL
Specific Gravity, Urine: 1.012 (ref 1.005–1.030)
pH: 6 (ref 5.0–8.0)

## 2020-10-29 LAB — BASIC METABOLIC PANEL
Anion gap: 8 (ref 5–15)
BUN: 16 mg/dL (ref 8–23)
CO2: 27 mmol/L (ref 22–32)
Calcium: 9.2 mg/dL (ref 8.9–10.3)
Chloride: 101 mmol/L (ref 98–111)
Creatinine, Ser: 0.96 mg/dL (ref 0.44–1.00)
GFR, Estimated: >60 mL/min (ref >60)
Glucose, Bld: 109 mg/dL — ABNORMAL HIGH (ref 70–99)
Potassium: 4.1 mmol/L (ref 3.5–5.1)
Sodium: 136 mmol/L (ref 135–145)

## 2020-10-29 MED ORDER — MORPHINE SULFATE (PF) 4 MG/ML IV SOLN
4.0000 mg | Freq: Once | INTRAVENOUS | Status: AC
  Administered 2020-10-29: 17:00:00 4 mg via INTRAVENOUS
  Filled 2020-10-29: qty 1

## 2020-10-29 MED ORDER — LIDOCAINE 5 % EX PTCH: 1.0000 | MEDICATED_PATCH | CUTANEOUS | 0 refills | Status: AC

## 2020-10-29 MED ORDER — ONDANSETRON HCL 4 MG/2ML IJ SOLN
4.0000 mg | Freq: Once | INTRAMUSCULAR | Status: AC
  Administered 2020-10-29: 20:00:00 4 mg via INTRAVENOUS
  Filled 2020-10-29: qty 2

## 2020-10-29 MED ORDER — ONDANSETRON HCL 4 MG/2ML IJ SOLN
4.0000 mg | Freq: Once | INTRAMUSCULAR | Status: AC
  Administered 2020-10-29: 17:00:00 4 mg via INTRAVENOUS
  Filled 2020-10-29: qty 2

## 2020-10-29 MED ORDER — LORAZEPAM 0.5 MG PO TABS
0.5000 mg | ORAL_TABLET | Freq: Once | ORAL | Status: AC
  Administered 2020-10-29: 19:00:00 0.5 mg via ORAL
  Filled 2020-10-29: qty 1

## 2020-10-29 NOTE — ED Triage Notes (Signed)
Pt to er, pt states that she is here for back pain, states that she has a hx of degenerative disc disease.  Pt states that her back started hurting around 3 pm, pt states that she was in a painting class when the pain got worse, states that it has been a gradual onset that has gotten worse and worse.  Denies bending or injury, states that she has taken 6 advil in the last few hours.

## 2020-10-29 NOTE — ED Notes (Signed)
Pt ambulated in room with 1 person assist.

## 2020-10-29 NOTE — ED Notes (Signed)
Pt given crackers for her nausea per pts request

## 2020-10-29 NOTE — ED Provider Notes (Signed)
Care assumed from [facility], [place]. See her note for full H&P.   Per her note, "  Ariel Cook is a 75 y.o. female.     Back Pain Associated symptoms: no abdominal pain, no chest pain, no dysuria, no fever, no numbness and no weakness         Ariel Cook is a 75 y.o. female with past medical history of degenerative disc disease, sciatica, asthma and chronic low back pain, she presents to the Emergency Department complaining of gradually worsening pain of her lower back.  She states that her back has been hurting for several days but worse today around 1:00 today.  States she is waiting for a epidural spinal injection.  She took ibuprofen today without relief.  She took more ibuprofen couple hours later and pain has not improved.  Six ibuprofen within 4 hours.  Severe since 3 pm today while attending a painting class.  She describes sharp pain to lower spine that radiate to left buttock and thigh.  Occasional pain to left foot.  States her pain is similar to previous back pain, but more intense today.  Denies known injury, fever, chills, dysuria, abdominal pain, numbness or weakness of her lower extremities.  No urine or bowel changes.  " Physical Exam  BP 136/90 (BP Location: Left Arm)   Pulse 96   Temp 98.4 F (36.9 C) (Oral)   Resp 18   Ht 4\' 11"  (1.499 m)   Wt 69.9 kg   SpO2 99%   BMI 31.10 kg/m   Physical Exam Vitals and nursing note reviewed.  Constitutional:      General: She is not in acute distress.    Appearance: She is well-developed.  HENT:     Head: Normocephalic and atraumatic.  Eyes:     Conjunctiva/sclera: Conjunctivae normal.  Cardiovascular:     Rate and Rhythm: Normal rate.  Pulmonary:     Effort: Pulmonary effort is normal.  Musculoskeletal:        General: Normal range of motion.     Cervical back: Neck supple.  Skin:    General: Skin is warm and dry.  Neurological:     Mental Status: She is alert.    ED Course/Procedures      Procedures Results for orders placed or performed during the hospital encounter of 10/29/20  Urinalysis, Routine w reflex microscopic Urine, Clean Catch  Result Value Ref Range   Color, Urine YELLOW YELLOW   APPearance CLEAR CLEAR   Specific Gravity, Urine 1.012 1.005 - 1.030   pH 6.0 5.0 - 8.0   Glucose, UA NEGATIVE NEGATIVE mg/dL   Hgb urine dipstick NEGATIVE NEGATIVE   Bilirubin Urine NEGATIVE NEGATIVE   Ketones, ur NEGATIVE NEGATIVE mg/dL   Protein, ur NEGATIVE NEGATIVE mg/dL   Nitrite NEGATIVE NEGATIVE   Leukocytes,Ua NEGATIVE NEGATIVE  CBC with Differential  Result Value Ref Range   WBC 6.0 4.0 - 10.5 K/uL   RBC 3.98 3.87 - 5.11 MIL/uL   Hemoglobin 12.6 12.0 - 15.0 g/dL   HCT 37.8 36.0 - 46.0 %   MCV 95.0 80.0 - 100.0 fL   MCH 31.7 26.0 - 34.0 pg   MCHC 33.3 30.0 - 36.0 g/dL   RDW 11.9 11.5 - 15.5 %   Platelets 264 150 - 400 K/uL   nRBC 0.0 0.0 - 0.2 %   Neutrophils Relative % 59 %   Neutro Abs 3.6 1.7 - 7.7 K/uL   Lymphocytes Relative 31 %  Lymphs Abs 1.9 0.7 - 4.0 K/uL   Monocytes Relative 8 %   Monocytes Absolute 0.5 0.1 - 1.0 K/uL   Eosinophils Relative 1 %   Eosinophils Absolute 0.0 0.0 - 0.5 K/uL   Basophils Relative 1 %   Basophils Absolute 0.0 0.0 - 0.1 K/uL   Immature Granulocytes 0 %   Abs Immature Granulocytes 0.02 0.00 - 0.07 K/uL  Basic metabolic panel  Result Value Ref Range   Sodium 136 135 - 145 mmol/L   Potassium 4.1 3.5 - 5.1 mmol/L   Chloride 101 98 - 111 mmol/L   CO2 27 22 - 32 mmol/L   Glucose, Bld 109 (H) 70 - 99 mg/dL   BUN 16 8 - 23 mg/dL   Creatinine, Ser 0.96 0.44 - 1.00 mg/dL   Calcium 9.2 8.9 - 10.3 mg/dL   GFR, Estimated >60 >60 mL/min   Anion gap 8 5 - 15   MR LUMBAR SPINE WO CONTRAST  Result Date: 10/29/2020 CLINICAL DATA:  Low back pain. EXAM: MRI LUMBAR SPINE WITHOUT CONTRAST TECHNIQUE: Multiplanar, multisequence MR imaging of the lumbar spine was performed. No intravenous contrast was administered. COMPARISON:   10/19/2018 FINDINGS: Segmentation:  Standard. Alignment: Mild-to-moderate thoracolumbar dextroscoliosis and lower lumbar levoscoliosis. No significant listhesis in the sagittal plane. Vertebrae: No fracture or suspicious marrow lesion. Mild edema laterally in the L1 vertebral body on the left at the base of a bulky far lateral L1-2 osteophyte. Conus medullaris and cauda equina: Conus extends to the L1 level. Conus and cauda equina appear normal. Paraspinal and other soft tissues: Unremarkable. Disc levels: Disc desiccation and moderate disc space narrowing throughout the lumbar spine. T12-L1: Only imaged sagittally. Left eccentric disc bulging and facet hypertrophy, similar to the prior MRI without compressive stenosis. L1-2: Left eccentric disc bulging, endplate osteophytes, and mild facet hypertrophy result in mild left neural foraminal stenosis without spinal stenosis, unchanged. L2-3: Disc bulging eccentric to the left, endplate osteophytes, and moderate facet hypertrophy without significant stenosis, unchanged. L3-4: Right eccentric disc bulging, endplate osteophytes, and severe right and moderate left facet and ligamentum flavum hypertrophy result in mild spinal stenosis, moderate right and mild left lateral recess stenosis, and moderate right mild-to-moderate left neural foraminal stenosis, unchanged. L4-5: Right eccentric disc bulging, endplate osteophytes, and severe right and moderate left facet and ligamentum flavum hypertrophy result in moderate bilateral lateral recess stenosis and moderate right and mild left neural foraminal stenosis without significant spinal stenosis, unchanged. L5-S1: Right eccentric disc bulging, endplate osteophytes, and moderate right and mild left facet and ligamentum flavum hypertrophy result in moderate right neural foraminal stenosis without spinal stenosis, unchanged. IMPRESSION: Unchanged lumbar disc and facet degeneration with moderate multilevel lateral recess and neural  foraminal stenosis as above. Electronically Signed   By: Logan Bores M.D.   On: 10/29/2020 19:33   MM 3D SCREEN BREAST BILATERAL  Result Date: 10/19/2020 CLINICAL DATA:  Screening. EXAM: DIGITAL SCREENING BILATERAL MAMMOGRAM WITH TOMOSYNTHESIS AND CAD TECHNIQUE: Bilateral screening digital craniocaudal and mediolateral oblique mammograms were obtained. Bilateral screening digital breast tomosynthesis was performed. The images were evaluated with computer-aided detection. COMPARISON:  Previous exam(s). ACR Breast Density Category b: There are scattered areas of fibroglandular density. FINDINGS: There are no findings suspicious for malignancy. IMPRESSION: No mammographic evidence of malignancy. A result letter of this screening mammogram will be mailed directly to the patient. RECOMMENDATION: Screening mammogram in one year. (Code:SM-B-01Y) BI-RADS CATEGORY  1: Negative. Electronically Signed   By: Marin Olp M.D.  On: 10/19/2020 11:57     MDM   Pt here with acute exacerbation of chronic back pain. At shift change she is awaiting MRI. Labs unremarkable. MRI  Unchanged lumbar disc and facet degeneration with moderate multilevel lateral recess and neural foraminal stenosis as above. Pt feels better after meds in the ED. Has been ambulatory and feels well to go home. Rx for lidoderm patches given. Pcp f/u recommended with strict return precautions. She voices understanding and is in agreement with plan. All questions answered, pt stable for discharge.       Bishop Dublin 10/29/20 Arvin Collard, MD 10/31/20 [phone]

## 2020-10-29 NOTE — Discharge Instructions (Addendum)
Use lidoderm patches as directed   Please follow up with your primary care provider within 5-7 days for re-evaluation of your symptoms. If you do not have a primary care provider, information for a healthcare clinic has been provided for you to make arrangements for follow up care. Please return to the emergency department for any new or worsening symptoms.

## 2020-10-29 NOTE — ED Notes (Signed)
Patient transported to MRI 

## 2020-10-29 NOTE — ED Provider Notes (Signed)
[facility] EMERGENCY DEPARTMENT Provider Note   CSN: [id number] Arrival date & time: 10/29/20  1623     History Chief Complaint  Patient presents with   Back Pain    Ariel Cook is a 75 y.o. female.   Back Pain Associated symptoms: no abdominal pain, no chest pain, no dysuria, no fever, no numbness and no weakness        Ariel Cook is a 75 y.o. female with past medical history of degenerative disc disease, sciatica, asthma and chronic low back pain, she presents to the Emergency Department complaining of gradually worsening pain of her lower back.  She states that her back has been hurting for several days but worse today around 1:00 today.  States she is waiting for a epidural spinal injection.  She took ibuprofen today without relief.  She took more ibuprofen couple hours later and pain has not improved.  Six ibuprofen within 4 hours.  Severe since 3 pm today while attending a painting class.  She describes sharp pain to lower spine that radiate to left buttock and thigh.  Occasional pain to left foot.  States her pain is similar to previous back pain, but more intense today.  Denies known injury, fever, chills, dysuria, abdominal pain, numbness or weakness of her lower extremities.  No urine or bowel changes.   Past Medical History:  Diagnosis Date   Asthma    USES ALBUTEROL INHALER   DDD (degenerative disc disease), cervical    Headache(784.0)    Left carpal tunnel syndrome 09/03/2011   PONV (postoperative nausea and vomiting)    Sciatica    Unspecified adverse effect of anesthesia    CAUSES ASTHMA TO EXACERBATE, CAUSES MIGRAINES    Patient Active Problem List   Diagnosis Date Noted   Tubular adenoma of colon 04/06/2020   Depression 08/09/2019   Asthma with exacerbation 01/09/2015   Osteoarthritis cervical spine 03/10/2014   Chronic lower back pain 02/12/2014   Hereditary and idiopathic peripheral neuropathy 02/12/2014   Venous stasis 03/26/2013   Chest pain  03/26/2013   Left carpal tunnel syndrome 09/03/2011    Past Surgical History:  Procedure Laterality Date   APPENDECTOMY  2010   CARPAL TUNNEL RELEASE  2010   RT   CARPAL TUNNEL RELEASE  09/03/2011   Procedure: CARPAL TUNNEL RELEASE;  Surgeon: Johnny Bridge, MD;  Location: [facility];  Service: Orthopedics;  Laterality: Left;   COLONOSCOPY N/A 04/03/2020   Procedure: COLONOSCOPY;  Surgeon: Rogene Houston, MD;  [facility];  Service: Endoscopy;  Laterality: N/A;  730, per office moved to 12/2 @ 8:30   EYE SURGERY  2011   BIL CATARACT   POLYPECTOMY  04/03/2020   Procedure: POLYPECTOMY INTESTINAL;  Surgeon: Rogene Houston, MD;  Location: [facility];  Service: Endoscopy;;   TUBAL LIGATION  1976     OB History   No obstetric history on file.     Family History  Problem Relation Age of Onset   Hypertension Mother    Heart attack Father    Breast cancer Sister     Social History   Tobacco Use   Smoking status: Never   Smokeless tobacco: Never  Vaping Use   Vaping Use: Never used  Substance Use Topics   Alcohol use: No   Drug use: No    Home Medications Prior to Admission medications   Medication Sig Start Date End Date Taking? Authorizing Provider  acetaminophen (  TYLENOL) 650 MG CR tablet Take 650 mg by mouth at bedtime.    [provider]  albuterol (VENTOLIN HFA) 108 (90 Base) MCG/ACT inhaler Inhale 2 puffs into the lungs every 6 (six) hours as needed for wheezing or shortness of breath. 10/10/18   Mikey Kirschner, MD  Biotin 10000 MCG TABS Take 10,000 mcg by mouth daily.     [provider]  fluticasone (FLOVENT HFA) 110 MCG/ACT inhaler Inhale 2 puffs into the lungs 2 (two) times daily. 09/04/19   Lovena Le, Malena M, DO  Misc Natural Products (OSTEO BI-FLEX JOINT SHIELD PO) Take 2 tablets by mouth daily. Triple Strength with Turmeric    [provider]  Multiple Vitamins-Minerals (ICAPS AREDS 2 PO) Take 1 tablet by  mouth daily.     [provider]  Polyvinyl Alcohol-Povidone PF (REFRESH) 1.4-0.6 % SOLN Place 1 drop into both eyes daily as needed (Dry eye).    [provider]  triamterene-hydrochlorothiazide (DYAZIDE) 37.5-25 MG capsule Take 1 capsule by mouth once daily in the morning 07/31/20   Elvia Collum M, DO    Allergies    Sulfa antibiotics, Other, Stadol [butorphanol], and Codeine  Review of Systems   Review of Systems  Constitutional:  Negative for chills, fatigue and fever.  HENT:  Negative for sore throat and trouble swallowing.   Respiratory:  Negative for cough, shortness of breath and wheezing.   Cardiovascular:  Negative for chest pain and palpitations.  Gastrointestinal:  Negative for abdominal pain, blood in stool, nausea and vomiting.  Genitourinary:  Negative for dysuria, flank pain and hematuria.  Musculoskeletal:  Positive for back pain. Negative for arthralgias, myalgias, neck pain and neck stiffness.  Skin:  Negative for rash.  Neurological:  Negative for dizziness, weakness and numbness.  Hematological:  Does not bruise/bleed easily.   Physical Exam Updated Vital Signs BP 136/90 (BP Location: Left Arm)   Pulse 96   Temp 98.4 F (36.9 C) (Oral)   Resp 18   Ht 4\' 11"  (1.499 m)   Wt 69.9 kg   SpO2 99%   BMI 31.10 kg/m   Physical Exam Vitals and nursing note reviewed.  Constitutional:      Appearance: Normal appearance. She is not toxic-appearing.  HENT:     Head: Normocephalic.  Cardiovascular:     Rate and Rhythm: Normal rate and regular rhythm.     Pulses: Normal pulses.  Pulmonary:     Effort: Pulmonary effort is normal.     Breath sounds: Normal breath sounds. No wheezing.  Abdominal:     Palpations: Abdomen is soft.     Tenderness: There is no abdominal tenderness. There is no guarding or rebound.  Musculoskeletal:        General: Tenderness present. Normal range of motion.     Cervical back: Normal range of motion.     Comments:  Diffuse ttp of the lower lumbar spine and left lumbar paraspinal muscles.  Pain reproduced with SLR on left.    Skin:    General: Skin is warm and dry.     Capillary Refill: Capillary refill takes less than 2 seconds.     Findings: No rash.  Neurological:     General: No focal deficit present.     Mental Status: She is alert.     Sensory: No sensory deficit.     Motor: No weakness.    ED Results / Procedures / Treatments   Labs (all labs ordered are listed, but  only abnormal results are displayed) Labs Reviewed  BASIC METABOLIC PANEL - Abnormal; Notable for the following components:      Result Value   Glucose, Bld 109 (*)    All other components within normal limits  URINALYSIS, ROUTINE W REFLEX MICROSCOPIC  CBC WITH DIFFERENTIAL/PLATELET    EKG None  Radiology No results found.  Procedures Procedures   Medications Ordered in ED Medications  morphine 4 MG/ML injection 4 mg (has no administration in time range)  ondansetron (ZOFRAN) injection 4 mg (has no administration in time range)    ED Course  I have reviewed the triage vital signs and the nursing notes.  Pertinent labs & imaging results that were available during my care of the patient were reviewed by me and considered in my medical decision making (see chart for details).    MDM Rules/Calculators/A&P                          Patient with known history of chronic low back pain here with worsening pain today.  No known injury.  She is awaiting an epidural spinal injection.  She had MRI L-spine in June 2020 that showed degenerative changes of the lower thoracic and upper lumbar with foraminal stenosis and narrowing at L5-S1.  1905  pt has ambulated in the room.  Pain somewhat improved after morphine.  U/A and MRI results pending.  Discussed findings with Dr. Roderic Palau and with Coral Ceo PA-C who assumes care at end of shift.  If MR w/o acute findings, she will likely be discharged home.  States she cannot take  narcotic pain medication.    Final Clinical Impression(s) / ED Diagnoses Final diagnoses:  None    Rx / DC Orders ED Discharge Orders     None        Kem Parkinson, Hershal Coria 10/29/20 Reuben Likes, MD 10/31/20 1032

## 2020-10-29 NOTE — ED Notes (Signed)
Pt requesting crackers.

## 2020-11-10 DIAGNOSIS — M4180 Other forms of scoliosis, site unspecified: Secondary | ICD-10-CM | POA: Diagnosis not present

## 2020-12-02 DIAGNOSIS — M5416 Radiculopathy, lumbar region: Secondary | ICD-10-CM | POA: Diagnosis not present

## 2020-12-16 ENCOUNTER — Other Ambulatory Visit: Payer: Self-pay | Admitting: Family Medicine

## 2020-12-17 DIAGNOSIS — M4180 Other forms of scoliosis, site unspecified: Secondary | ICD-10-CM | POA: Diagnosis not present

## 2020-12-17 DIAGNOSIS — M5416 Radiculopathy, lumbar region: Secondary | ICD-10-CM | POA: Diagnosis not present

## 2021-01-15 DIAGNOSIS — H00021 Hordeolum internum right upper eyelid: Secondary | ICD-10-CM | POA: Diagnosis not present

## 2021-01-21 DIAGNOSIS — H00021 Hordeolum internum right upper eyelid: Secondary | ICD-10-CM | POA: Diagnosis not present

## 2021-01-30 ENCOUNTER — Other Ambulatory Visit: Payer: Self-pay

## 2021-01-30 ENCOUNTER — Ambulatory Visit (INDEPENDENT_AMBULATORY_CARE_PROVIDER_SITE_OTHER): Payer: Medicare PPO | Admitting: Ophthalmology

## 2021-01-30 ENCOUNTER — Encounter (INDEPENDENT_AMBULATORY_CARE_PROVIDER_SITE_OTHER): Payer: Self-pay | Admitting: Ophthalmology

## 2021-01-30 DIAGNOSIS — I10 Essential (primary) hypertension: Secondary | ICD-10-CM | POA: Diagnosis not present

## 2021-01-30 DIAGNOSIS — H353132 Nonexudative age-related macular degeneration, bilateral, intermediate dry stage: Secondary | ICD-10-CM

## 2021-01-30 DIAGNOSIS — H35033 Hypertensive retinopathy, bilateral: Secondary | ICD-10-CM | POA: Diagnosis not present

## 2021-01-30 DIAGNOSIS — Z961 Presence of intraocular lens: Secondary | ICD-10-CM | POA: Diagnosis not present

## 2021-01-30 DIAGNOSIS — D3132 Benign neoplasm of left choroid: Secondary | ICD-10-CM

## 2021-01-30 DIAGNOSIS — H3581 Retinal edema: Secondary | ICD-10-CM

## 2021-01-30 NOTE — Progress Notes (Signed)
[facility] Note  01/30/2021     CHIEF COMPLAINT Patient presents for Retina Evaluation   HISTORY OF PRESENT ILLNESS: Ariel Cook is a 75 y.o. female who presents to the clinic today for:   HPI     Retina Evaluation   In right eye.  This started 5 days ago.  Duration of 5 days.  Associated Symptoms Floaters.  Negative for Flashes, Distortion, Blind Spot, Pain, Redness, Photophobia, Glare, Trauma, Scalp Tenderness, Jaw Claudication, Shoulder/Hip pain, Fever, Weight Loss and Fatigue.  Context:  distance vision, mid-range vision, near vision and reading.  Treatments tried include no treatments.  I, the attending physician,  performed the HPI with the patient and updated documentation appropriately.        Comments   Patient states she is having double vision for the past 5 days. Patient says double images are "mostly in right eye.". Patient had stye removed with Dr. Clent Jacks about 2 weeks ago under RUL. Right eye feels irritated, like sand in eye.      Last edited by Bernarda Caffey, MD on 01/30/2021  8:55 AM.    Pt is a regular pt of Dr. Zigmund Daniel, complaining of shadowy, double vision, she states it is mostly in her right eye and seems to be with near vision only, she noticed it about 5 days ago while watching TV, pt had cataract sx about 15 years ago with Dr. Katy Fitch, pt had a stye removed from her RUL about 10 days ago, she states she is getting bad headaches from trying to focus  Referring physician: [facility] [address] [place],  [place] [address]  HISTORICAL INFORMATION:   Selected notes from the MEDICAL RECORD NUMBER Referred by [facility] to r/o retinal etiology of decreased vision diplopia LEE:  Ocular Hx- PMH-    CURRENT MEDICATIONS: Current Outpatient Medications (Ophthalmic Drugs)  Medication Sig   Polyvinyl Alcohol-Povidone PF (REFRESH) 1.4-0.6 % SOLN Place 1 drop into both eyes daily as needed (Dry  eye).   No current facility-administered medications for this visit. (Ophthalmic Drugs)   Current Outpatient Medications (Other)  Medication Sig   acetaminophen (TYLENOL) 650 MG CR tablet Take 650 mg by mouth every 8 (eight) hours as needed for pain.   albuterol (VENTOLIN HFA) 108 (90 Base) MCG/ACT inhaler Inhale 2 puffs into the lungs every 6 (six) hours as needed for wheezing or shortness of breath.   Biotin 10000 MCG TABS Take 10,000 mcg by mouth daily.    cyclobenzaprine (FLEXERIL) 10 MG tablet Take 10 mg by mouth 2 (two) times daily as needed.   fluticasone (FLOVENT HFA) 110 MCG/ACT inhaler Inhale 2 puffs into the lungs 2 (two) times daily.   gabapentin (NEURONTIN) 300 MG capsule Take 10 mg by mouth in the morning, at noon, and at bedtime.   ibuprofen (ADVIL) 200 MG tablet Take 600 mg by mouth every 6 (six) hours as needed.   lidocaine (LIDODERM) 5 % Place 1 patch onto the skin daily. Remove & Discard patch within 12 hours or as directed by MD   Misc Natural Products (OSTEO BI-FLEX JOINT SHIELD PO) Take 2 tablets by mouth daily. Triple Strength with Turmeric   triamterene-hydrochlorothiazide (DYAZIDE) 37.5-25 MG capsule Take 1 capsule by mouth once daily in the morning   Multiple Vitamins-Minerals (ICAPS AREDS 2 PO) Take 2 tablets by mouth daily. (Patient not taking: Reported on 01/30/2021)   predniSONE (DELTASONE) 10 MG tablet  Take by mouth. (Patient not taking: No sig reported)   No current facility-administered medications for this visit. (Other)   REVIEW OF SYSTEMS: ROS   Positive for: Musculoskeletal, Eyes, Respiratory Negative for: Constitutional, Gastrointestinal, Neurological, Skin, Genitourinary, HENT, Endocrine, Cardiovascular, Psychiatric, Allergic/Imm, Heme/Lymph Last edited by Jobe Marker, COT on 01/30/2021  7:58 AM.     ALLERGIES Allergies  Allergen Reactions   Sulfa Antibiotics Anaphylaxis   Other     Pain meds (cannot tolerate NARCOTICS)    Stadol  [Butorphanol]     Vomiting and excessive drowsiness   Codeine Palpitations    PAST MEDICAL HISTORY Past Medical History:  Diagnosis Date   Asthma    USES ALBUTEROL INHALER   DDD (degenerative disc disease), cervical    Headache(784.0)    Left carpal tunnel syndrome 09/03/2011   PONV (postoperative nausea and vomiting)    Sciatica    Unspecified adverse effect of anesthesia    CAUSES ASTHMA TO EXACERBATE, CAUSES MIGRAINES   Past Surgical History:  Procedure Laterality Date   APPENDECTOMY  2010   CARPAL TUNNEL RELEASE  2010   RT   CARPAL TUNNEL RELEASE  09/03/2011   Procedure: CARPAL TUNNEL RELEASE;  Surgeon: Johnny Bridge, MD;  Location: [place];  Service: Orthopedics;  Laterality: Left;   COLONOSCOPY N/A 04/03/2020   Procedure: COLONOSCOPY;  Surgeon: Rogene Houston, MD;  [facility];  Service: Endoscopy;  Laterality: N/A;  730, per office moved to 12/2 @ 8:30   EYE SURGERY  2011   BIL CATARACT   POLYPECTOMY  04/03/2020   Procedure: POLYPECTOMY INTESTINAL;  Surgeon: Rogene Houston, MD;  [facility];  Service: Endoscopy;;   TUBAL LIGATION  1976   FAMILY HISTORY Family History  Problem Relation Age of Onset   Glaucoma Mother    Hypertension Mother    Heart attack Father    Retinal detachment Sister    Diabetes Sister    Breast cancer Sister    Diabetes Maternal Aunt    Diabetes Maternal Uncle    SOCIAL HISTORY Social History   Tobacco Use   Smoking status: Never   Smokeless tobacco: Never  Vaping Use   Vaping Use: Never used  Substance Use Topics   Alcohol use: No   Drug use: No       OPHTHALMIC EXAM:  Base Eye Exam     Visual Acuity (Snellen - Linear)       Right Left   Dist [place] 20/60 20/30 +1   Dist ph [place] 20/40 +2 20/30 +2         Tonometry (Tonopen, 8:21 AM)       Right Left   Pressure 20 18         Pupils       Dark Light Shape React APD   Right 6 4 Round Brisk None   Left 6 4 Round Brisk  None         Visual Fields (Counting fingers)       Left Right    Full Full         Extraocular Movement       Right Left    Full, Ortho Full, Ortho  No movement on cover testing        Neuro/Psych     Oriented x3: Yes   Mood/Affect: Normal         Dilation     Both eyes: 1.0% Mydriacyl, 2.5% Phenylephrine @  8:21 AM           Slit Lamp and Fundus Exam     Slit Lamp Exam       Right Left   Lids/Lashes Dermatochalasis - upper lid, mild MGD Dermatochalasis - upper lid, mild MGD   Conjunctiva/Sclera White and quiet White and quiet   Cornea arcus, trace PEE, tear film debris arcus, tear film debris   Anterior Chamber Deep and quiet Deep and quiet   Iris Round and dilated Round and dilated   Lens 3 piece PC IOL in good position with open PC 3 piece PC IOL in good position with open PC   Vitreous Mild Vitreous syneresis, Posterior vitreous detachment, vitreous condensations Mild Vitreous syneresis, Posterior vitreous detachment, vitreous condensations         Fundus Exam       Right Left   Disc mild Pallor, +PPA mild Pallor, +PPA, Sharp rim, Compact   C/D Ratio 0.2 0.2   Macula Flat, Blunted foveal reflex, Drusen, RPE mottling, No heme or edema Flat, Blunted foveal reflex, Drusen, central PED, RPE mottling and clumping, No heme or edema/fluid   Vessels mild attenuation, mild tortuousity mild attenuation, mild tortuousity   Periphery Attached, No heme  Attached, choroidal nevus at 0330, No heme            Refraction     Manifest Refraction       Sphere Cylinder Axis Dist VA   Right -0.50 +0.75 055 20/40-2   Left -0.75 +0.75 135 20/30-2           IMAGING AND PROCEDURES  Imaging and Procedures for 01/30/2021  OCT, Retina - OU - Both Eyes       Right Eye Quality was good. Central Foveal Thickness: 270. Progression has been stable. Findings include normal foveal contour, no IRF, no SRF, retinal drusen .   Left Eye Central Foveal  Thickness: 288. Progression has been stable. Findings include normal foveal contour, no IRF, no SRF, retinal drusen , pigment epithelial detachment (Mild progression of central PED).   Notes *Images captured and stored on drive  Diagnosis / Impression:  NFP, no IRF/SRF OU OS: Mild progression of central PED  Clinical management:  See below  Abbreviations: NFP - Normal foveal profile. CME - cystoid macular edema. PED - pigment epithelial detachment. IRF - intraretinal fluid. SRF - subretinal fluid. EZ - ellipsoid zone. ERM - epiretinal membrane. ORA - outer retinal atrophy. ORT - outer retinal tubulation. SRHM - subretinal hyper-reflective material. IRHM - intraretinal hyper-reflective material            ASSESSMENT/PLAN:    ICD-10-CM   1. Intermediate stage nonexudative age-related macular degeneration of both eyes  H35.3132     2. Retinal edema  H35.81 OCT, Retina - OU - Both Eyes    3. Essential hypertension  I10     4. Hypertensive retinopathy of both eyes  H35.033     5. Pseudophakia, both eyes  Z96.1     6. Choroidal nevus of left eye  D31.32      **Presents for acute f/u / re-referral from [facility] for decreased vision and diplopia (OD > OS)**  - pt reports shadow image greater at near than distance  - pt states she had a sty removed from R upper eyelid ~2 wks ago -- states vision changes started several days after procedure  - no retinal pathology or obvious ophthalmic pathology present on exam to explain symptoms  -  recommend further evaluation at [facility]  1,2. Age related macular degeneration, non-exudative, both eyes  - under the expert management of Dr. Zigmund Daniel  - exam and OCT essentially stable without significant progression of disease  - f/u as scheduled with Dr. Zigmund Daniel  3,4. Hypertensive retinopathy OU - discussed importance of tight BP control - monitor . 5. Pseudophakia OU  - s/p CE/IOL  - IOL in good position, doing well  -  monitor  6. Choroidal Nevus, OS  - flat, pigmented lesion at 0330  - no visual symptoms, SRF or orange pigment  - thickness < 64mm  - f/u as scheduled with Dr. Zigmund Daniel  Ophthalmic Meds Ordered this visit:  No orders of the defined types were placed in this encounter.    Return for as scheduled with Dr. Zigmund Daniel.  There are no Patient Instructions on file for this visit.  Explained the diagnoses, plan, and follow up with the patient and they expressed understanding.  Patient expressed understanding of the importance of proper follow up care.   This document serves as a record of services personally performed by Gardiner Sleeper, MD, PhD. It was created on their behalf by San Jetty. Owens Shark, OA an ophthalmic technician. The creation of this record is the provider's dictation and/or activities during the visit.    Electronically signed by: San Jetty. Owens Shark, [place] 09.30.2022 4:41 PM  Gardiner Sleeper, M.D., Ph.D. Diseases & Surgery of the Retina and Vitreous [facility]  I have reviewed the above documentation for accuracy and completeness, and I agree with the above. Gardiner Sleeper, M.D., Ph.D. 01/30/21 4:50 PM  Abbreviations: M myopia (nearsighted); A astigmatism; H hyperopia (farsighted); P presbyopia; Mrx spectacle prescription;  CTL contact lenses; OD right eye; OS left eye; OU both eyes  XT exotropia; ET esotropia; PEK punctate epithelial keratitis; PEE punctate epithelial erosions; DES dry eye syndrome; MGD meibomian gland dysfunction; ATs artificial tears; PFAT's preservative free artificial tears; [place] nuclear sclerotic cataract; PSC posterior subcapsular cataract; ERM epi-retinal membrane; PVD posterior vitreous detachment; RD retinal detachment; DM diabetes mellitus; DR diabetic retinopathy; NPDR non-proliferative diabetic retinopathy; PDR proliferative diabetic retinopathy; CSME clinically significant macular edema; DME diabetic macular edema; dbh dot blot  hemorrhages; CWS cotton wool spot; POAG primary open angle glaucoma; C/D cup-to-disc ratio; HVF humphrey visual field; GVF goldmann visual field; OCT optical coherence tomography; IOP intraocular pressure; BRVO Branch retinal vein occlusion; CRVO central retinal vein occlusion; CRAO central retinal artery occlusion; BRAO branch retinal artery occlusion; RT retinal tear; SB scleral buckle; PPV pars plana vitrectomy; VH Vitreous hemorrhage; PRP panretinal laser photocoagulation; IVK intravitreal kenalog; VMT vitreomacular traction; MH Macular hole;  NVD neovascularization of the disc; NVE neovascularization elsewhere; AREDS age related eye disease study; ARMD age related macular degeneration; POAG primary open angle glaucoma; EBMD epithelial/anterior basement membrane dystrophy; ACIOL anterior chamber intraocular lens; IOL intraocular lens; PCIOL posterior chamber intraocular lens; Phaco/IOL phacoemulsification with intraocular lens placement; [place] photorefractive keratectomy; LASIK laser assisted in situ keratomileusis; HTN hypertension; DM diabetes mellitus; COPD chronic obstructive pulmonary disease

## 2021-02-11 ENCOUNTER — Encounter (INDEPENDENT_AMBULATORY_CARE_PROVIDER_SITE_OTHER): Payer: Medicare PPO | Admitting: Ophthalmology

## 2021-02-11 ENCOUNTER — Other Ambulatory Visit: Payer: Self-pay

## 2021-02-11 DIAGNOSIS — H353132 Nonexudative age-related macular degeneration, bilateral, intermediate dry stage: Secondary | ICD-10-CM | POA: Diagnosis not present

## 2021-02-11 DIAGNOSIS — D3132 Benign neoplasm of left choroid: Secondary | ICD-10-CM | POA: Diagnosis not present

## 2021-02-11 DIAGNOSIS — H43813 Vitreous degeneration, bilateral: Secondary | ICD-10-CM | POA: Diagnosis not present

## 2021-03-02 ENCOUNTER — Other Ambulatory Visit: Payer: Self-pay

## 2021-03-02 ENCOUNTER — Ambulatory Visit (HOSPITAL_COMMUNITY): Payer: Medicare PPO | Attending: Neurological Surgery | Admitting: Physical Therapy

## 2021-03-02 ENCOUNTER — Encounter (HOSPITAL_COMMUNITY): Payer: Self-pay | Admitting: Physical Therapy

## 2021-03-02 DIAGNOSIS — M6281 Muscle weakness (generalized): Secondary | ICD-10-CM | POA: Diagnosis present

## 2021-03-02 DIAGNOSIS — R29898 Other symptoms and signs involving the musculoskeletal system: Secondary | ICD-10-CM

## 2021-03-02 DIAGNOSIS — M545 Low back pain, unspecified: Secondary | ICD-10-CM | POA: Diagnosis present

## 2021-03-02 DIAGNOSIS — R2689 Other abnormalities of gait and mobility: Secondary | ICD-10-CM | POA: Diagnosis present

## 2021-03-02 NOTE — Therapy (Signed)
[place], [place], [address] Phone: [phone]   Fax:  [phone]  Physical Therapy Evaluation  Patient Details  Name: RHAYNE CHATWIN MRN: [id number] Date of Birth: 27-Sep-1945 Referring Provider (PT): Pieter Partridge Dawley DO   Encounter Date: 03/02/2021   PT End of Session - 03/02/21 1352     Visit Number 1    Number of Visits 12    Date for PT Re-Evaluation 04/13/21    Authorization Type [employer] (yes auth, no vl)    Progress Note Due on Visit 10    PT Start Time 1312    PT Stop Time 1350    PT Time Calculation (min) 38 min    Activity Tolerance Patient tolerated treatment well    Behavior During Therapy [facility] for tasks assessed/performed             Past Medical History:  Diagnosis Date   Asthma    USES ALBUTEROL INHALER   DDD (degenerative disc disease), cervical    Headache(784.0)    Left carpal tunnel syndrome 09/03/2011   PONV (postoperative nausea and vomiting)    Sciatica    Unspecified adverse effect of anesthesia    CAUSES ASTHMA TO EXACERBATE, CAUSES MIGRAINES    Past Surgical History:  Procedure Laterality Date   APPENDECTOMY  2010   CARPAL TUNNEL RELEASE  2010   RT   CARPAL TUNNEL RELEASE  09/03/2011   Procedure: CARPAL TUNNEL RELEASE;  Surgeon: Johnny Bridge, MD;  Location: [place];  Service: Orthopedics;  Laterality: Left;   COLONOSCOPY N/A 04/03/2020   Procedure: COLONOSCOPY;  Surgeon: Rogene Houston, MD;  [facility];  Service: Endoscopy;  Laterality: N/A;  730, per office moved to 12/2 @ 8:30   EYE SURGERY  2011   BIL CATARACT   POLYPECTOMY  04/03/2020   Procedure: POLYPECTOMY INTESTINAL;  Surgeon: Rogene Houston, MD;  [facility];  Service: Endoscopy;;   TUBAL LIGATION  1976    There were no vitals filed for this visit.    Subjective Assessment - 03/02/21 1303     Subjective Patient is a 75 y.o. female who presents to  physical therapy with c/o back pain. Patient states she has 2 S curves in her spine. She has arthritis and sciatica. She is having pain in R side of low back and down to her toes. She has had issues for years but this last time began about 2 months ago. She also has pinched nerves in her whole back. Her main goal is to stay on her feet and decrease pain. She needs to be able to take care of son on the weekends. She cant stand for very long or stand for very long. Sitting messes up leg.    Limitations Lifting;Standing;Walking;House hold activities;Sitting    Patient Stated Goals stay on her feet and decrease pain    Currently in Pain? Yes    Pain Score 5     Pain Location Back    Pain Orientation Right    Pain Descriptors / Indicators Burning    Pain Type Chronic pain    Pain Onset More than a month ago    Pain Frequency Constant                [facility] PT Assessment - 03/02/21 0001       Assessment   Medical Diagnosis Degenerative Scoliosis    Referring Provider (PT) Pieter Partridge  Dawley DO    Onset Date/Surgical Date 12/31/20    Next MD Visit December    Prior Therapy none      Precautions   Precautions None      Restrictions   Weight Bearing Restrictions No      Balance Screen   Has the patient fallen in the past 6 months No    Has the patient had a decrease in activity level because of a fear of falling?  No    Is the patient reluctant to leave their home because of a fear of falling?  No      Prior Function   Level of Independence Independent    Vocation Retired      Charity fundraiser Status Within Functional Limits for tasks assessed      Observation/Other Assessments   Observations frequent positional changes, shifted off R hip in seated; bilateral LE edema    Focus on Therapeutic Outcomes (FOTO)  complete next session      Sensation   Light Touch Appears Intact      Posture/Postural Control   Posture/Postural Control Postural limitations    Postural  Limitations Rounded Shoulders;Forward head;Decreased lumbar lordosis      ROM / Strength   AROM / PROM / Strength AROM;Strength      AROM   Overall AROM Comments worst with extension    AROM Assessment Site Lumbar    Lumbar Flexion 0% limited    Lumbar Extension 75% limited    Lumbar - Right Side Bend 50% limited    Lumbar - Left Side Bend 50% limited    Lumbar - Right Rotation 25% limited    Lumbar - Left Rotation 25% limited      Strength   Overall Strength Comments pain in low back with hip, knee testing, difficulty testing knees due to bilateral skin tenderness    Strength Assessment Site Hip;Knee;Ankle    Right/Left Hip Right;Left    Right Hip Flexion 3/5    Left Hip Flexion 4-/5    Right/Left Knee Right;Left    Right Knee Flexion 4/5    Right Knee Extension 3+/5    Left Knee Flexion 4/5    Left Knee Extension 3+/5    Right/Left Ankle Right;Left    Right Ankle Dorsiflexion 5/5    Left Ankle Dorsiflexion 5/5      Palpation   Palpation comment TTP bilateral LE edema; grossly tender thoracic and lumbar paraspinals, greatest tenderness R QL and glutes      Transfers   Comments relies on LLE and UE use, labored      Ambulation/Gait   Ambulation/Gait Yes    Ambulation/Gait Assistance 6: Modified independent (Device/Increase time)    Ambulation Distance (Feet) 100 Feet    Assistive device None    Gait Pattern Trunk flexed    Gait Comments reaches for nearby objects for support                        Objective measurements completed on examination: See above findings.       [place] Adult PT Treatment/Exercise - 03/02/21 0001       Exercises   Exercises Lumbar      Lumbar Exercises: Stretches   Single Knee to Chest Stretch Right;Left;3 reps;20 seconds    Other Lumbar Stretch Exercise prone lying, not tolerated well only able to perform for 1 minute      Lumbar Exercises: Supine  Ab Set 10 reps;5 seconds                     PT  Education - 03/02/21 1301     Education Details Patient educated on exam findings, POC, scope of PT, HEP    Person(s) Educated Patient    Methods Explanation;Demonstration    Comprehension Verbalized understanding;Returned demonstration              PT Short Term Goals - 03/02/21 1357       PT SHORT TERM GOAL #1   Title Patient will be independent with HEP in order to improve functional outcomes.    Time 3    Period Weeks    Status New    Target Date 03/23/21      PT SHORT TERM GOAL #2   Title Patient will report at least 25% improvement in symptoms for improved quality of life.    Time 3    Period Weeks    Status New    Target Date 03/23/21               PT Long Term Goals - 03/02/21 1358       PT LONG TERM GOAL #1   Title Patient will report at least 75% improvement in symptoms for improved quality of life.    Time 6    Period Weeks    Status New    Target Date 04/13/21      PT LONG TERM GOAL #2   Title Patient will demonstrate at least 25% improvement in lumbar ROM in all restricted planes for improved ability to move trunk while at home.    Time 6    Period Weeks    Status New    Target Date 04/13/21                    Plan - 03/02/21 1353     Clinical Impression Statement Patient is a 75 y.o. female who presents to physical therapy with c/o back pain. She presents with pain limited deficits in lumbar strength, ROM, endurance, postural impairments, spinal mobility and functional mobility with ADL. She is having to modify and restrict ADL as indicated by subjective information and objective measures which is affecting overall participation. Patient will benefit from skilled physical therapy in order to improve function and reduce impairment.    Personal Factors and Comorbidities Fitness;Comorbidity 1;Comorbidity 2;Time since onset of injury/illness/exacerbation    Comorbidities arthritis, chronic back pain    Examination-Activity Limitations  Locomotion Level;Transfers;Bend;Lift;Stand;Stairs;Squat;Bed Mobility;Sit;Carry    Examination-Participation Restrictions Meal Prep;Cleaning;Community Activity;Shop;Volunteer;Dorita Sciara    Stability/Clinical Decision Making Evolving/Moderate complexity    Clinical Decision Making Moderate    Rehab Potential Fair    PT Frequency 2x / week    PT Duration 6 weeks    PT Treatment/Interventions ADLs/Self Care Home Management;Aquatic Therapy;Electrical Stimulation;Iontophoresis 4mg /ml Dexamethasone;Moist Heat;Traction;Ultrasound;DME Instruction;Gait training;Stair training;Functional mobility training;Therapeutic activities;Therapeutic exercise;Balance training;Neuromuscular re-education;Patient/family education;Orthotic Fit/Training;Manual techniques;Manual lymph drainage;Compression bandaging;Scar mobilization;Passive range of motion;Dry needling;Energy conservation;Splinting;Taping;Spinal Manipulations;Joint Manipulations    PT Next Visit Plan begin core strengthening, complete FOTO, spinal mobility    PT Home Exercise Plan SKTC, ab set    Consulted and Agree with Plan of Care Patient             Patient will benefit from skilled therapeutic intervention in order to improve the following deficits and impairments:  Abnormal gait, Difficulty walking, Decreased range of motion, Decreased endurance, Decreased activity tolerance, Pain, Decreased  balance, Impaired flexibility, Improper body mechanics, Postural dysfunction, Decreased strength, Decreased mobility  Visit Diagnosis: Low back pain, unspecified back pain laterality, unspecified chronicity, unspecified whether sciatica present  Muscle weakness (generalized)  Other abnormalities of gait and mobility  Other symptoms and signs involving the musculoskeletal system     Problem List Patient Active Problem List   Diagnosis Date Noted   Tubular adenoma of colon 04/06/2020   Depression 08/09/2019   Asthma with exacerbation  01/09/2015   Osteoarthritis cervical spine 03/10/2014   Chronic lower back pain 02/12/2014   Hereditary and idiopathic peripheral neuropathy 02/12/2014   Venous stasis 03/26/2013   Chest pain 03/26/2013   Left carpal tunnel syndrome 09/03/2011    2:00 PM, 03/02/21 Mearl Latin PT, DPT Physical Therapist at [facility] [place], [place], [address] Phone: [phone]   Fax:  [phone]  Name: JOHNNETTA HOLSTINE MRN: [id number] Date of Birth: [birth year]

## 2021-03-02 NOTE — Patient Instructions (Signed)
Access Code: [email or web address] URL: [email or web address] Date: 03/02/2021 Prepared by: Mitzi Hansen Calven Gilkes  Exercises Hooklying Single Knee to Chest Stretch (Mirrored) - 3 x daily - 7 x weekly - 3 reps - 20 second hold Abdominal Bracing - 3 x daily - 7 x weekly - 10 reps - 5 second hold

## 2021-03-13 ENCOUNTER — Encounter (HOSPITAL_COMMUNITY): Payer: Medicare PPO

## 2021-03-16 ENCOUNTER — Encounter (HOSPITAL_COMMUNITY): Payer: Self-pay

## 2021-03-16 ENCOUNTER — Other Ambulatory Visit: Payer: Self-pay

## 2021-03-16 ENCOUNTER — Ambulatory Visit (HOSPITAL_COMMUNITY): Payer: Medicare PPO | Attending: Neurological Surgery

## 2021-03-16 DIAGNOSIS — R2689 Other abnormalities of gait and mobility: Secondary | ICD-10-CM | POA: Diagnosis present

## 2021-03-16 DIAGNOSIS — M6281 Muscle weakness (generalized): Secondary | ICD-10-CM | POA: Diagnosis present

## 2021-03-16 DIAGNOSIS — R29898 Other symptoms and signs involving the musculoskeletal system: Secondary | ICD-10-CM | POA: Diagnosis present

## 2021-03-16 DIAGNOSIS — M545 Low back pain, unspecified: Secondary | ICD-10-CM | POA: Insufficient documentation

## 2021-03-16 NOTE — Patient Instructions (Signed)
Flexors, Supine Bridge    Lie supine, feet shoulder-width apart. Lift hips toward ceiling. Hold _2-3__ seconds. Repeat 5-15_ times per session. Do _1__ sessions per day.  Copyright  VHI. All rights reserved.

## 2021-03-16 NOTE — Therapy (Signed)
[place], [place], [address] Phone: [phone]   Fax:  [phone]  Physical Therapy Treatment  Patient Details  Name: Ariel Cook MRN: [id number] Date of Birth: 1945-06-05 Referring Provider (PT): Pieter Partridge Dawley DO   Encounter Date: 03/16/2021   PT End of Session - 03/16/21 0937     Visit Number 2    Number of Visits 12    Date for PT Re-Evaluation 04/13/21    Authorization Type [employer] Medicare (yes auth, no vl)    Progress Note Due on Visit 10    PT Start Time [phone]    PT Stop Time 1025    PT Time Calculation (min) 47 min    Activity Tolerance Patient tolerated treatment well    Behavior During Therapy [facility] for tasks assessed/performed             Past Medical History:  Diagnosis Date   Asthma    USES ALBUTEROL INHALER   DDD (degenerative disc disease), cervical    Headache(784.0)    Left carpal tunnel syndrome 09/03/2011   PONV (postoperative nausea and vomiting)    Sciatica    Unspecified adverse effect of anesthesia    CAUSES ASTHMA TO EXACERBATE, CAUSES MIGRAINES    Past Surgical History:  Procedure Laterality Date   APPENDECTOMY  2010   CARPAL TUNNEL RELEASE  2010   RT   CARPAL TUNNEL RELEASE  09/03/2011   Procedure: CARPAL TUNNEL RELEASE;  Surgeon: Johnny Bridge, MD;  Location: [place];  Service: Orthopedics;  Laterality: Left;   COLONOSCOPY N/A 04/03/2020   Procedure: COLONOSCOPY;  Surgeon: Rogene Houston, MD;  [facility];  Service: Endoscopy;  Laterality: N/A;  730, per office moved to 12/2 @ 8:30   EYE SURGERY  2011   BIL CATARACT   POLYPECTOMY  04/03/2020   Procedure: POLYPECTOMY INTESTINAL;  Surgeon: Rogene Houston, MD;  [facility];  Service: Endoscopy;;   TUBAL LIGATION  1976    There were no vitals filed for this visit.   Subjective Assessment - 03/16/21 0936     Subjective Pain has really been bad the past few days. Has added  taking Advil; takes gabapentin and Tylenol 650 time release; pain a little calmer right now.    Limitations Lifting;Standing;Walking;House hold activities;Sitting    Patient Stated Goals stay on her feet and decrease pain    Currently in Pain? Yes    Pain Score 5     Pain Location Back    Pain Orientation Right;Posterior    Pain Descriptors / Indicators Aching;Burning;Dull;Sharp;Shooting;Throbbing;Numbness    Pain Radiating Towards Right hip, thigh, knee to 1st and 2nd toe    Pain Onset More than a month ago    Pain Relieving Factors changing positions                [facility] PT Assessment - 03/16/21 0001       Observation/Other Assessments   Focus on Therapeutic Outcomes (FOTO)  57% function                [facility] Adult PT Treatment/Exercise - 03/16/21 0001       Transfers   Transfers Supine to Sit;Sit to Supine    Supine to Sit 5: Supervision;4: Min guard    Supine to Sit Details Tactile cues for placement;Tactile cues for weight shifting;Verbal cues for sequencing;Verbal cues for technique;Visual cues/gestures for sequencing    Sit to  Supine 5: Supervision    Sit to Supine Details (indicate cue type and reason) Visual cues/gestures for sequencing;Verbal cues for sequencing;Verbal cues for technique               PT Education - 03/16/21 1009     Education Details Iniital evaluation, goals, FOTO, initial HEP reviewed. Advance HEP.    Person(s) Educated Patient    Methods Explanation;Handout    Comprehension Verbalized understanding              PT Short Term Goals - 03/02/21 1357       PT SHORT TERM GOAL #1   Title Patient will be independent with HEP in order to improve functional outcomes.    Time 3    Period Weeks    Status New    Target Date 03/23/21      PT SHORT TERM GOAL #2   Title Patient will report at least 25% improvement in symptoms for improved quality of life.    Time 3    Period Weeks    Status New    Target Date 03/23/21                PT Long Term Goals - 03/02/21 1358       PT LONG TERM GOAL #1   Title Patient will report at least 75% improvement in symptoms for improved quality of life.    Time 6    Period Weeks    Status New    Target Date 04/13/21      PT LONG TERM GOAL #2   Title Patient will demonstrate at least 25% improvement in lumbar ROM in all restricted planes for improved ability to move trunk while at home.    Time 6    Period Weeks    Status New    Target Date 04/13/21                   Plan - 03/16/21 2585     Clinical Impression Statement Reviewed initial evaluation, goals and initial HEP. Completed FOTO with 57% function. Patient required verbal and tactile cues for correct completion of initial HEP of ab set and single knee to chest. Added supine bridge with limited excursion due to pain and weakness to therapeutic exercises and HEP. Patient instructed in log roll and will require further instructions. Patient reported no change in pain level indicating pain in bilateral lumbar paraspinals/quadratus lumborum area, described as burning. Patient will benefit from skilled physical therapy in order to improve function and reduce impairment.    Personal Factors and Comorbidities Fitness;Comorbidity 1;Comorbidity 2;Time since onset of injury/illness/exacerbation    Comorbidities arthritis, chronic back pain    Examination-Activity Limitations Locomotion Level;Transfers;Bend;Lift;Stand;Stairs;Squat;Bed Mobility;Sit;Carry    Examination-Participation Restrictions Meal Prep;Cleaning;Community Activity;Shop;Volunteer;Dorita Sciara    Stability/Clinical Decision Making Evolving/Moderate complexity    Rehab Potential Fair    PT Frequency 2x / week    PT Duration 6 weeks    PT Treatment/Interventions ADLs/Self Care Home Management;Aquatic Therapy;Electrical Stimulation;Iontophoresis 4mg /ml Dexamethasone;Moist Heat;Traction;Ultrasound;DME Instruction;Gait training;Stair  training;Functional mobility training;Therapeutic activities;Therapeutic exercise;Balance training;Neuromuscular re-education;Patient/family education;Orthotic Fit/Training;Manual techniques;Manual lymph drainage;Compression bandaging;Scar mobilization;Passive range of motion;Dry needling;Energy conservation;Splinting;Taping;Spinal Manipulations;Joint Manipulations    PT Next Visit Plan begin core strengthening, spinal mobility, log roll, possibly manual for pain relief    PT Home Exercise Plan SKTC, ab set; 11/14 supine bridge    Consulted and Agree with Plan of Care Patient  Patient will benefit from skilled therapeutic intervention in order to improve the following deficits and impairments:  Abnormal gait, Difficulty walking, Decreased range of motion, Decreased endurance, Decreased activity tolerance, Pain, Decreased balance, Impaired flexibility, Improper body mechanics, Postural dysfunction, Decreased strength, Decreased mobility  Visit Diagnosis: Low back pain, unspecified back pain laterality, unspecified chronicity, unspecified whether sciatica present  Muscle weakness (generalized)  Other abnormalities of gait and mobility  Other symptoms and signs involving the musculoskeletal system     Problem List Patient Active Problem List   Diagnosis Date Noted   Tubular adenoma of colon 04/06/2020   Depression 08/09/2019   Asthma with exacerbation 01/09/2015   Osteoarthritis cervical spine 03/10/2014   Chronic lower back pain 02/12/2014   Hereditary and idiopathic peripheral neuropathy 02/12/2014   Venous stasis 03/26/2013   Chest pain 03/26/2013   Left carpal tunnel syndrome 09/03/2011   Floria Raveling. Hartnett-Rands, MS, PT Per [place] [phone]  Jeannie Done, PT 03/16/2021, 10:45 AM  [place] [address] [place], [place], [address] Phone: [phone]   Fax:  [phone]  Name:  SOFIAH LYNE MRN: [id number] Date of Birth: August 16, 1945

## 2021-03-18 ENCOUNTER — Other Ambulatory Visit: Payer: Self-pay

## 2021-03-18 ENCOUNTER — Encounter (HOSPITAL_COMMUNITY): Payer: Self-pay | Admitting: Physical Therapy

## 2021-03-18 ENCOUNTER — Ambulatory Visit (HOSPITAL_COMMUNITY): Payer: Medicare PPO | Admitting: Physical Therapy

## 2021-03-18 DIAGNOSIS — M545 Low back pain, unspecified: Secondary | ICD-10-CM

## 2021-03-18 DIAGNOSIS — M6281 Muscle weakness (generalized): Secondary | ICD-10-CM

## 2021-03-18 DIAGNOSIS — R2689 Other abnormalities of gait and mobility: Secondary | ICD-10-CM

## 2021-03-18 NOTE — Therapy (Signed)
[place], [place], [address] Phone: [phone]   Fax:  [phone]  Physical Therapy Treatment  Patient Details  Name: Ariel Cook MRN: [id number] Date of Birth: 1946-04-02 Referring Provider (PT): Pieter Partridge Dawley DO   Encounter Date: 03/18/2021   PT End of Session - 03/18/21 1333     Visit Number 3    Number of Visits 12    Date for PT Re-Evaluation 04/13/21    Authorization Type [employer] (yes auth, no vl)    Progress Note Due on Visit 10    PT Start Time 1320    PT Stop Time 1400    PT Time Calculation (min) 40 min    Activity Tolerance Patient tolerated treatment well    Behavior During Therapy [facility] for tasks assessed/performed             Past Medical History:  Diagnosis Date   Asthma    USES ALBUTEROL INHALER   DDD (degenerative disc disease), cervical    Headache(784.0)    Left carpal tunnel syndrome 09/03/2011   PONV (postoperative nausea and vomiting)    Sciatica    Unspecified adverse effect of anesthesia    CAUSES ASTHMA TO EXACERBATE, CAUSES MIGRAINES    Past Surgical History:  Procedure Laterality Date   APPENDECTOMY  2010   CARPAL TUNNEL RELEASE  2010   RT   CARPAL TUNNEL RELEASE  09/03/2011   Procedure: CARPAL TUNNEL RELEASE;  Surgeon: Johnny Bridge, MD;  Location: [place];  Service: Orthopedics;  Laterality: Left;   COLONOSCOPY N/A 04/03/2020   Procedure: COLONOSCOPY;  Surgeon: Rogene Houston, MD;  [facility];  Service: Endoscopy;  Laterality: N/A;  730, per office moved to 12/2 @ 8:30   EYE SURGERY  2011   BIL CATARACT   POLYPECTOMY  04/03/2020   Procedure: POLYPECTOMY INTESTINAL;  Surgeon: Rogene Houston, MD;  [facility];  Service: Endoscopy;;   TUBAL LIGATION  1976    There were no vitals filed for this visit.   Subjective Assessment - 03/18/21 1320     Subjective Pt states that she is achey today.   She tried to do  some of the exercises.    Limitations Lifting;Standing;Walking;House hold activities;Sitting    Patient Stated Goals stay on her feet and decrease pain    Currently in Pain? Yes    Pain Score 7     Pain Location Back    Pain Orientation Lower    Pain Descriptors / Indicators Burning    Pain Type Chronic pain    Pain Radiating Towards Rt foot    Pain Onset More than a month ago    Pain Frequency Constant    Aggravating Factors  not sure    Pain Relieving Factors changing postion    Effect of Pain on Daily Activities limits                               [facility] Adult PT Treatment/Exercise - 03/18/21 0001       Exercises   Exercises Lumbar      Lumbar Exercises: Stretches   Active Hamstring Stretch Left;Right;3 reps;20 seconds    Single Knee to Chest Stretch Right;Left;3 reps;20 seconds    Lower Trunk Rotation 5 reps      Lumbar Exercises: Seated   Other Seated Lumbar Exercises ab set/ tall  posture/ x 10      Lumbar Exercises: Supine   Ab Set 10 reps;5 seconds    Bent Knee Raise 5 reps    Bent Knee Raise Limitations x 2 sets    Other Supine Lumbar Exercises decompression ex 1-5                     PT Education - 03/18/21 1330     Education Details Additional exercise              PT Short Term Goals - 03/18/21 1354       PT SHORT TERM GOAL #1   Title Patient will be independent with HEP in order to improve functional outcomes.    Time 3    Period Weeks    Status On-going    Target Date 03/23/21      PT SHORT TERM GOAL #2   Title Patient will report at least 25% improvement in symptoms for improved quality of life.    Time 3    Period Weeks    Status On-going    Target Date 03/23/21               PT Long Term Goals - 03/18/21 1355       PT LONG TERM GOAL #1   Title Patient will report at least 75% improvement in symptoms for improved quality of life.    Time 6    Period Weeks    Status On-going      PT LONG  TERM GOAL #2   Title Patient will demonstrate at least 25% improvement in lumbar ROM in all restricted planes for improved ability to move trunk while at home.    Time 6    Period Weeks    Status On-going                   Plan - 03/18/21 1351     Clinical Impression Statement Pt completed HEP on a limited basis.  Continues to have increased pain with radicular sx to RT. Pt able to demonstrate proper technique for bed mobility.  Instructed pt in decompression exercises.    Personal Factors and Comorbidities Fitness;Comorbidity 1;Comorbidity 2;Time since onset of injury/illness/exacerbation    Comorbidities arthritis, chronic back pain    Examination-Activity Limitations Locomotion Level;Transfers;Bend;Lift;Stand;Stairs;Squat;Bed Mobility;Sit;Carry    Examination-Participation Restrictions Meal Prep;Cleaning;Community Activity;Shop;Volunteer;Dorita Sciara    Stability/Clinical Decision Making Evolving/Moderate complexity    Rehab Potential Fair    PT Frequency 2x / week    PT Duration 6 weeks    PT Treatment/Interventions ADLs/Self Care Home Management;Aquatic Therapy;Electrical Stimulation;Iontophoresis 4mg /ml Dexamethasone;Moist Heat;Traction;Ultrasound;DME Instruction;Gait training;Stair training;Functional mobility training;Therapeutic activities;Therapeutic exercise;Balance training;Neuromuscular re-education;Patient/family education;Orthotic Fit/Training;Manual techniques;Manual lymph drainage;Compression bandaging;Scar mobilization;Passive range of motion;Dry needling;Energy conservation;Splinting;Taping;Spinal Manipulations;Joint Manipulations    PT Next Visit Plan begin core strengthening, spinal mobility, log roll, possibly manual for pain relief    PT Home Exercise Plan SKTC, ab set; 11/14 supine bridge: 11/15 decompression ex 1-5, bent knee raise; sitting ab set and tall posture    Consulted and Agree with Plan of Care Patient             Patient will benefit  from skilled therapeutic intervention in order to improve the following deficits and impairments:  Abnormal gait, Difficulty walking, Decreased range of motion, Decreased endurance, Decreased activity tolerance, Pain, Decreased balance, Impaired flexibility, Improper body mechanics, Postural dysfunction, Decreased strength, Decreased mobility  Visit Diagnosis: Low back pain, unspecified back pain laterality,  unspecified chronicity, unspecified whether sciatica present  Muscle weakness (generalized)  Other abnormalities of gait and mobility     Problem List Patient Active Problem List   Diagnosis Date Noted   Tubular adenoma of colon 04/06/2020   Depression 08/09/2019   Asthma with exacerbation 01/09/2015   Osteoarthritis cervical spine 03/10/2014   Chronic lower back pain 02/12/2014   Hereditary and idiopathic peripheral neuropathy 02/12/2014   Venous stasis 03/26/2013   Chest pain 03/26/2013   Left carpal tunnel syndrome 09/03/2011  Rayetta Humphrey, PT CLT [phone]  03/18/2021, 2:03 PM  [facility] [address] [place], [place], [address] Phone: [phone]   Fax:  [phone]  Name: Ariel Cook MRN: [id number] Date of Birth: Apr 07, 1946

## 2021-03-24 ENCOUNTER — Encounter (HOSPITAL_COMMUNITY): Payer: Medicare PPO | Admitting: Physical Therapy

## 2021-03-31 ENCOUNTER — Encounter (HOSPITAL_COMMUNITY): Payer: Medicare PPO | Admitting: Physical Therapy

## 2021-04-02 ENCOUNTER — Encounter (HOSPITAL_COMMUNITY): Payer: Medicare PPO

## 2021-04-03 ENCOUNTER — Encounter (HOSPITAL_COMMUNITY): Payer: Self-pay

## 2021-04-03 ENCOUNTER — Ambulatory Visit (HOSPITAL_COMMUNITY): Payer: Medicare PPO

## 2021-04-03 NOTE — Therapy (Signed)
[place], [place], [address] Phone: [phone]   Fax:  [phone]  Patient Details  Name: Ariel Cook MRN: [id number] Date of Birth: 11-06-1945 Referring Provider:  No ref. provider found  Encounter Date: 04/03/2021 PHYSICAL THERAPY DISCHARGE SUMMARY  Visits from Start of Care: 3  Current functional level related to goals / functional outcomes: Unable to determine. Patient not returning and cancelled remainder of visits   Remaining deficits: N/A   Education / Equipment: HEP initiated   Patient agrees to discharge. Patient goals were not met. Patient is being discharged due to the patient's request.   8:04 AM, 04/03/21 M. Sherlyn Lees, PT, DPT Physical Therapist- [place] Office Number: [phone]   [facility] [address] [place], [place], [address] Phone: [phone]   Fax:  [phone]

## 2021-04-07 ENCOUNTER — Encounter (HOSPITAL_COMMUNITY): Payer: Medicare PPO | Admitting: Physical Therapy

## 2021-04-09 ENCOUNTER — Encounter (HOSPITAL_COMMUNITY): Payer: Medicare PPO | Admitting: Physical Therapy

## 2021-04-10 ENCOUNTER — Encounter (HOSPITAL_COMMUNITY): Payer: Medicare PPO

## 2021-04-13 ENCOUNTER — Encounter (HOSPITAL_COMMUNITY): Payer: Medicare PPO | Admitting: Physical Therapy

## 2021-04-15 ENCOUNTER — Encounter (HOSPITAL_COMMUNITY): Payer: Medicare PPO

## 2021-07-22 ENCOUNTER — Ambulatory Visit: Payer: Medicare PPO | Admitting: Family Medicine

## 2021-07-22 ENCOUNTER — Encounter: Payer: Self-pay | Admitting: Family Medicine

## 2021-07-22 VITALS — BP 122/78 | HR 62 | Temp 98.3°F | Ht 59.0 in | Wt 154.5 lb

## 2021-07-22 DIAGNOSIS — R600 Localized edema: Secondary | ICD-10-CM

## 2021-07-22 DIAGNOSIS — G8929 Other chronic pain: Secondary | ICD-10-CM

## 2021-07-22 DIAGNOSIS — J4530 Mild persistent asthma, uncomplicated: Secondary | ICD-10-CM | POA: Insufficient documentation

## 2021-07-22 DIAGNOSIS — F331 Major depressive disorder, recurrent, moderate: Secondary | ICD-10-CM

## 2021-07-22 DIAGNOSIS — M545 Low back pain, unspecified: Secondary | ICD-10-CM

## 2021-07-22 MED ORDER — TRIAMTERENE-HCTZ 37.5-25 MG PO CAPS: 1.0000 | ORAL_CAPSULE | Freq: Every morning | ORAL | 1 refills | Status: DC

## 2021-07-22 MED ORDER — ESCITALOPRAM OXALATE 5 MG PO TABS: 5.0000 mg | ORAL_TABLET | Freq: Every day | ORAL | 1 refills | Status: DC

## 2021-07-22 MED ORDER — FLUTICASONE PROPIONATE HFA 110 MCG/ACT IN AERO: 2.0000 | INHALATION_SPRAY | Freq: Two times a day (BID) | RESPIRATORY_TRACT | 3 refills | Status: DC

## 2021-07-22 MED ORDER — ALBUTEROL SULFATE HFA 108 (90 BASE) MCG/ACT IN AERS: 2.0000 | INHALATION_SPRAY | Freq: Four times a day (QID) | RESPIRATORY_TRACT | 1 refills | Status: DC | PRN

## 2021-07-22 NOTE — Progress Notes (Signed)
? ?New Patient Office Visit ? ?Subjective:  ?Patient ID: Ariel Cook, female    DOB: 08-01-1945  Age: 76 y.o. MRN: [id number] ? ?CC:  ?Chief Complaint  ?Patient presents with  ? Establish Care  ?  Need new PCP, haven't been seen in two years ?Need medications refilled ?  ? ? ?HPI-retired Systems developer. ?Ariel Cook presents for new pt-doc retired ? Edema-taking dyazide-ran out as no doc. Worse as day progresses.  Wears compression stockings.  Some Raynaud's as well.  ?McKenzie not work-off meds long time.  Doesn't want meds.  Has to raise quad son(lives in group home).  No appetite.  Drinks pot of coffee/day.   No SI, but "lives for son".   ?Asthma-out of meds for "awhile" and doing ok ?Back pain-getting injections from pain mgmt.   ? ? ?Past Medical History:  ?Diagnosis Date  ? Allergy Always  ? Anemia Youth  ? Anxiety   ? Asthma   ? USES ALBUTEROL INHALER  ? Cataract 2011  ? Surgery  ? DDD (degenerative disc disease), cervical   ? Depression Chronic  ? GERD (gastroesophageal reflux disease) Ongoing  ? Headache(784.0)   ? Left carpal tunnel syndrome 09/03/2011  ? Osteoporosis   ? PONV (postoperative nausea and vomiting)   ? Sciatica   ? Unspecified adverse effect of anesthesia   ? CAUSES ASTHMA TO EXACERBATE, CAUSES MIGRAINES  ? ? ?Past Surgical History:  ?Procedure Laterality Date  ? APPENDECTOMY  2010  ? CARPAL TUNNEL RELEASE  2010  ? RT  ? CARPAL TUNNEL RELEASE  09/03/2011  ? Procedure: CARPAL TUNNEL RELEASE;  Surgeon: Johnny Bridge, MD;  Location: [facility];  Service: Orthopedics;  Laterality: Left;  ? COLONOSCOPY N/A 04/03/2020  ? Procedure: COLONOSCOPY;  Surgeon: Rogene Houston, MD;  [facility];  Service: Endoscopy;  Laterality: N/A;  730, per office moved to 12/2 @ 8:30  ? EYE SURGERY  2011  ? BIL CATARACT  ? POLYPECTOMY  04/03/2020  ? Procedure: POLYPECTOMY INTESTINAL;  Surgeon: Rogene Houston, MD;  [facility];  Service: Endoscopy;;  ? [place]  ? ? ?Family History  ?Problem Relation Age of Onset  ? Heart disease Mother   ? COPD Mother   ? Glaucoma Mother   ? Hypertension Mother   ? Heart attack Father   ? Arthritis Father   ? COPD Father   ? Heart disease Father   ? Hypertension Father   ? Stroke Sister   ? Intellectual disability Sister   ? Hypertension Sister   ? Hyperlipidemia Sister   ? Early death Sister   ? Drug abuse Sister   ? COPD Sister   ? Arthritis Sister   ? Retinal detachment Sister   ? Diabetes Sister   ? Breast cancer Sister   ? Cancer Sister   ? Depression Sister   ? Kidney disease Sister   ? Obesity Sister   ? Learning disabilities Son   ? Birth defects Son   ? Intellectual disability Son   ? Mental retardation Son   ? Diabetes Maternal Aunt   ? Diabetes Maternal Uncle   ? Hyperlipidemia Maternal Grandmother   ? Depression Maternal Grandmother   ? Arthritis Maternal Grandmother   ? Varicose Veins Maternal Grandmother   ? Birth defects Maternal Grandfather   ? Arthritis Maternal Grandfather   ? Hyperlipidemia Paternal Grandmother   ? Heart disease Paternal Grandmother   ?  Arthritis Paternal Grandmother   ? Arthritis Paternal Grandfather   ? Vision loss Paternal Grandfather   ? ? ?Social History  ? ?Socioeconomic History  ? Marital status: Widowed  ?  Spouse name: Not on file  ? Number of children: 2  ? Years of education: Not on file  ? Highest education level: Not on file  ?Occupational History  ? Not on file  ?Tobacco Use  ? Smoking status: Never  ? Smokeless tobacco: Never  ?Vaping Use  ? Vaping Use: Never used  ?Substance and Sexual Activity  ? Alcohol use: No  ? Drug use: No  ? Sexual activity: Not Currently  ?Other Topics Concern  ? Not on file  ?Social History Narrative  ? Not on file  ? ?Social Determinants of Health  ? ?Financial Resource Strain: Not on file  ?Food Insecurity: Not on file  ?Transportation Needs: Not on file  ?Physical Activity: Not on file  ?Stress: Not on file  ?Social Connections: Not on file   ?Intimate Partner Violence: Not on file  ? ? ?ROS  ?ROS: ?Chronic HA, some neuropathy, chronic back pain ?Occ heart pounding-anxiety. ? ?Objective:  ? ?Today's Vitals: BP 122/78   Pulse 62   Temp 98.3 ?F (36.8 ?C) (Temporal)   Ht '4\' 11"'$  (1.499 m)   Wt 154 lb 8 oz (70.1 kg)   SpO2 98%   BMI 31.21 kg/m?  ? ?Physical Exam  ?Gen: WDWN NAD [facility] ?HEENT: NCAT, conjunctiva not injected, sclera nonicteric ?TM WNL B, OP moist, no exudates .  Hearing aids but still HOH ?NECK:  supple, no thyromegaly, no nodes, no carotid bruits ?CARDIAC: RRR, S1S2+, no murmur. DP 2+B ?LUNGS: CTAB. No wheezes ?ABDOMEN:  BS+, soft, NTND, No HSM, no masses ?EXT:  2+edema ?MSK: no gross abnormalities.-hard to get up and down from table  ?NEURO: A&O x3.  CN II-XII intact.  ?PSYCH: normal mood. Good eye contact  ? ?Assessment & Plan:  ? ?Problem List Items Addressed This Visit   ? ?  ? Respiratory  ? Mild persistent asthma without complication  ? Relevant Medications  ? fluticasone (FLOVENT HFA) 110 MCG/ACT inhaler  ? albuterol (VENTOLIN HFA) 108 (90 Base) MCG/ACT inhaler  ?  ? Other  ? Chronic lower back pain  ? Relevant Medications  ? escitalopram (LEXAPRO) 5 MG tablet  ? Depression  ? Relevant Medications  ? escitalopram (LEXAPRO) 5 MG tablet  ? Localized edema - Primary  ? Chronic edema-restart dyazide.  F/u 1 mo labs ?Mild persistent asthma-stable chronic.  Pt not active.  Will restart flovent esp as allergy season.  Albuterol prn ?Depression-chronic, recurrant.  Not on any meds currently.  Zoloft not work.  Will start Lexapro.  Eat small meals during day.  F/u 1 mo ?LBP-seeing pain mgmt-cont. ? ?Outpatient Encounter Medications as of 07/22/2021  ?Medication Sig  ? acetaminophen (TYLENOL) 650 MG CR tablet Take 650 mg by mouth every 8 (eight) hours as needed for pain.  ? Biotin 10000 MCG TABS Take 10,000 mcg by mouth daily.   ? cyclobenzaprine (FLEXERIL) 10 MG tablet Take 10 mg by mouth 2 (two) times daily as needed.  ? escitalopram  (LEXAPRO) 5 MG tablet Take 1 tablet (5 mg total) by mouth daily.  ? ibuprofen (ADVIL) 200 MG tablet Take 600 mg by mouth every 6 (six) hours as needed.  ? lidocaine (LIDODERM) 5 % Place 1 patch onto the skin daily. Remove & Discard patch within 12 hours or as directed  by MD  ? Misc Natural Products (OSTEO BI-FLEX JOINT SHIELD PO) Take 2 tablets by mouth daily. Triple Strength with Turmeric  ? Multiple Vitamins-Minerals (PRESERVISION AREDS 2) CAPS SMARTSIG:1 By Mouth  ? Polyvinyl Alcohol-Povidone PF (REFRESH) 1.4-0.6 % SOLN Place 1 drop into both eyes daily as needed (Dry eye).  ? [DISCONTINUED] albuterol (VENTOLIN HFA) 108 (90 Base) MCG/ACT inhaler Inhale 2 puffs into the lungs every 6 (six) hours as needed for wheezing or shortness of breath.  ? [DISCONTINUED] fluticasone (FLOVENT HFA) 110 MCG/ACT inhaler Inhale 2 puffs into the lungs 2 (two) times daily.  ? [DISCONTINUED] SERTRALINE HCL PO Take 50 mg by mouth.  ? [DISCONTINUED] tiZANidine (ZANAFLEX) 4 MG capsule SMARTSIG:1 By Mouth  ? [DISCONTINUED] triamterene-hydrochlorothiazide (DYAZIDE) 37.5-25 MG capsule Take 1 capsule by mouth once daily in the morning  ? albuterol (VENTOLIN HFA) 108 (90 Base) MCG/ACT inhaler Inhale 2 puffs into the lungs every 6 (six) hours as needed for wheezing or shortness of breath.  ? fluticasone (FLOVENT HFA) 110 MCG/ACT inhaler Inhale 2 puffs into the lungs 2 (two) times daily.  ? triamterene-hydrochlorothiazide (DYAZIDE) 37.5-25 MG capsule Take 1 each (1 capsule total) by mouth every morning.  ? [DISCONTINUED] gabapentin (NEURONTIN) 300 MG capsule Take 10 mg by mouth in the morning, at noon, and at bedtime.  ? [DISCONTINUED] Multiple Vitamins-Minerals (ICAPS AREDS 2 PO) Take 2 tablets by mouth daily. (Patient not taking: Reported on 01/30/2021)  ? [DISCONTINUED] predniSONE (DELTASONE) 10 MG tablet Take by mouth. (Patient not taking: No sig reported)  ? ?No facility-administered encounter medications on file as of 07/22/2021.   ? ? ?Follow-up: Return in about 4 weeks (around 08/19/2021) for 1-2 months-edema, labs, depression.  ? ?Wellington Hampshire, MD ?

## 2021-07-22 NOTE — Patient Instructions (Signed)
Welcome to [facility] at [place]! It was a pleasure meeting you today.  As discussed, Please schedule a 1 month follow up visit today.  PLEASE NOTE:  If you had any LAB tests please let us know if you have not heard back within a few days. You may see your results on MyChart before we have a chance to review them but we will give you a call once they are reviewed by us. If we ordered any REFERRALS today, please let us know if you have not heard from their office within the next week.  Let us know through MyChart if you are needing REFILLS, or have your pharmacy send us the request. You can also use MyChart to communicate with me or any office staff.  Please try these tips to maintain a healthy lifestyle:  Eat most of your calories during the day when you are active. Eliminate processed foods including packaged sweets (pies, cakes, cookies), reduce intake of potatoes, white bread, white pasta, and white rice. Look for whole grain options, oat flour or almond flour.  Each meal should contain half fruits/vegetables, one quarter protein, and one quarter carbs (no bigger than a computer mouse).  Cut down on sweet beverages. This includes juice, soda, and sweet tea. Also watch fruit intake, though this is a healthier sweet option, it still contains natural sugar! Limit to 3 servings daily.  Drink at least 1 glass of water with each meal and aim for at least 8 glasses per day  Exercise at least 150 minutes every week.   

## 2021-08-19 ENCOUNTER — Encounter: Payer: Self-pay | Admitting: Family Medicine

## 2021-08-19 ENCOUNTER — Ambulatory Visit: Payer: Medicare PPO | Admitting: Family Medicine

## 2021-08-19 VITALS — BP 120/60 | HR 74 | Temp 98.7°F | Ht 59.0 in | Wt 151.4 lb

## 2021-08-19 DIAGNOSIS — F324 Major depressive disorder, single episode, in partial remission: Secondary | ICD-10-CM | POA: Diagnosis not present

## 2021-08-19 DIAGNOSIS — Z833 Family history of diabetes mellitus: Secondary | ICD-10-CM

## 2021-08-19 DIAGNOSIS — Z1159 Encounter for screening for other viral diseases: Secondary | ICD-10-CM

## 2021-08-19 DIAGNOSIS — R600 Localized edema: Secondary | ICD-10-CM

## 2021-08-19 LAB — COMPREHENSIVE METABOLIC PANEL
ALT: 15 U/L (ref 0–35)
AST: 17 U/L (ref 0–37)
Albumin: 4.3 g/dL (ref 3.5–5.2)
Alkaline Phosphatase: 91 U/L (ref 39–117)
BUN: 18 mg/dL (ref 6–23)
CO2: 33 mEq/L — ABNORMAL HIGH (ref 19–32)
Calcium: 9.6 mg/dL (ref 8.4–10.5)
Chloride: 99 mEq/L (ref 96–112)
Creatinine, Ser: 0.79 mg/dL (ref 0.40–1.20)
GFR: 73.44 mL/min (ref >60.00)
Glucose, Bld: 90 mg/dL (ref 70–99)
Potassium: 4 mEq/L (ref 3.5–5.1)
Sodium: 138 mEq/L (ref 135–145)
Total Bilirubin: 0.7 mg/dL (ref 0.2–1.2)
Total Protein: 6.6 g/dL (ref 6.0–8.3)

## 2021-08-19 LAB — CBC WITH DIFFERENTIAL/PLATELET
Basophils Absolute: 0 10*3/uL (ref 0.0–0.1)
Basophils Relative: 0.5 % (ref 0.0–3.0)
Eosinophils Absolute: 0 10*3/uL (ref 0.0–0.7)
Eosinophils Relative: 0.6 % (ref 0.0–5.0)
HCT: 41.2 % (ref 36.0–46.0)
Hemoglobin: 13.8 g/dL (ref 12.0–15.0)
Lymphocytes Relative: 33.5 % (ref 12.0–46.0)
Lymphs Abs: 2.1 10*3/uL (ref 0.7–4.0)
MCHC: 33.4 g/dL (ref 30.0–36.0)
MCV: 95.4 fl (ref 78.0–100.0)
Monocytes Absolute: 0.5 10*3/uL (ref 0.1–1.0)
Monocytes Relative: 7.4 % (ref 3.0–12.0)
Neutro Abs: 3.6 10*3/uL (ref 1.4–7.7)
Neutrophils Relative %: 58 % (ref 43.0–77.0)
Platelets: 294 10*3/uL (ref 150.0–400.0)
RBC: 4.32 Mil/uL (ref 3.87–5.11)
RDW: 13.6 % (ref 11.5–15.5)
WBC: 6.2 10*3/uL (ref 4.0–10.5)

## 2021-08-19 LAB — LIPID PANEL
Cholesterol: 224 mg/dL — ABNORMAL HIGH (ref 0–200)
HDL: 79.1 mg/dL (ref >39.00)
LDL Cholesterol: 128 mg/dL — ABNORMAL HIGH (ref 0–99)
NonHDL: 145.33
Total CHOL/HDL Ratio: 3
Triglycerides: 87 mg/dL (ref 0.0–149.0)
VLDL: 17.4 mg/dL (ref 0.0–40.0)

## 2021-08-19 LAB — MAGNESIUM: Magnesium: 1.9 mg/dL (ref 1.5–2.5)

## 2021-08-19 LAB — HEMOGLOBIN A1C: Hgb A1c MFr Bld: 5.8 % (ref 4.6–6.5)

## 2021-08-19 LAB — TSH: TSH: 1.91 u[IU]/mL (ref 0.35–5.50)

## 2021-08-19 MED ORDER — ESCITALOPRAM OXALATE 10 MG PO TABS: 10.0000 mg | ORAL_TABLET | Freq: Every day | ORAL | 1 refills | Status: DC

## 2021-08-19 NOTE — Patient Instructions (Signed)
It was very nice to see you today! ? ?Increase potassium and magnesium-bananas, pickle juice.   ? ? ?PLEASE NOTE: ? ?If you had any lab tests please let us know if you have not heard back within a few days. You may see your results on MyChart before we have a chance to review them but we will give you a call once they are reviewed by [place]. If we ordered any referrals today, please let us know if you have not heard from their office within the next week.  ? ?Please try these tips to maintain a healthy lifestyle: ? ?Eat most of your calories during the day when you are active. Eliminate processed foods including packaged sweets (pies, cakes, cookies), reduce intake of potatoes, white bread, white pasta, and white rice. Look for whole grain options, oat flour or almond flour. ? ?Each meal should contain half fruits/vegetables, one quarter protein, and one quarter carbs (no bigger than a computer mouse). ? ?Cut down on sweet beverages. This includes juice, soda, and sweet tea. Also watch fruit intake, though this is a healthier sweet option, it still contains natural sugar! Limit to 3 servings daily. ? ?Drink at least 1 glass of water with each meal and aim for at least 8 glasses per day ? ?Exercise at least 150 minutes every week.   ?

## 2021-08-19 NOTE — Progress Notes (Signed)
? ?Subjective:  ? ? ? Patient ID: Ariel Cook, female    DOB: [birth year], 76 y.o.   MRN: [id number] ? ?Chief Complaint  ?Patient presents with  ? Follow-up  ?  4 week follow-up on edema, depression and labs ?Does not feel like medication has made a whole lot of difference for depression  ? ? ?HPI ? Edema-back on diazide-some cramps in feet esp at night. Uses a lot of salt. ?Depression-on Lexapro-minimal help.  No motivation.  Doesn't do much.  No SI ? ?Health Maintenance Due  ?Topic Date Due  ? Hepatitis C Screening  Never done  ? Zoster Vaccines- Shingrix (1 of 2) Never done  ? TETANUS/TDAP  03/21/2007  ? Pneumonia Vaccine 73+ Years old (2 - PPSV23 if available, else PCV20) 03/10/2019  ? ? ?Past Medical History:  ?Diagnosis Date  ? Allergy Always  ? Anemia Youth  ? Anxiety   ? Asthma   ? USES ALBUTEROL INHALER  ? Cataract 2011  ? Surgery  ? DDD (degenerative disc disease), cervical   ? Depression Chronic  ? GERD (gastroesophageal reflux disease) Ongoing  ? Headache(784.0)   ? Left carpal tunnel syndrome 09/03/2011  ? Osteoporosis   ? PONV (postoperative nausea and vomiting)   ? Sciatica   ? Unspecified adverse effect of anesthesia   ? CAUSES ASTHMA TO EXACERBATE, CAUSES MIGRAINES  ? ? ?Past Surgical History:  ?Procedure Laterality Date  ? APPENDECTOMY  2010  ? CARPAL TUNNEL RELEASE  2010  ? RT  ? CARPAL TUNNEL RELEASE  09/03/2011  ? Procedure: CARPAL TUNNEL RELEASE;  Surgeon: Johnny Bridge, MD;  Location: [place];  Service: Orthopedics;  Laterality: Left;  ? COLONOSCOPY N/A 04/03/2020  ? Procedure: COLONOSCOPY;  Surgeon: Rogene Houston, MD;  [facility];  Service: Endoscopy;  Laterality: N/A;  730, per office moved to 12/2 @ 8:30  ? EYE SURGERY  2011  ? BIL CATARACT  ? POLYPECTOMY  04/03/2020  ? Procedure: POLYPECTOMY INTESTINAL;  Surgeon: Rogene Houston, MD;  [facility];  Service: Endoscopy;;  ? [place]  ? ? ?Outpatient Medications Prior to Visit   ?Medication Sig Dispense Refill  ? acetaminophen (TYLENOL) 650 MG CR tablet Take 650 mg by mouth every 8 (eight) hours as needed for pain.    ? albuterol (VENTOLIN HFA) 108 (90 Base) MCG/ACT inhaler Inhale 2 puffs into the lungs every 6 (six) hours as needed for wheezing or shortness of breath. 18 g 1  ? Biotin 10000 MCG TABS Take 10,000 mcg by mouth daily.     ? cyclobenzaprine (FLEXERIL) 10 MG tablet Take 10 mg by mouth 2 (two) times daily as needed.    ? escitalopram (LEXAPRO) 5 MG tablet Take 1 tablet (5 mg total) by mouth daily. 30 tablet 1  ? fluticasone (FLOVENT HFA) 110 MCG/ACT inhaler Inhale 2 puffs into the lungs 2 (two) times daily. 3 each 3  ? ibuprofen (ADVIL) 200 MG tablet Take 600 mg by mouth every 6 (six) hours as needed.    ? lidocaine (LIDODERM) 5 % Place 1 patch onto the skin daily. Remove & Discard patch within 12 hours or as directed by MD 30 patch 0  ? Misc Natural Products (OSTEO BI-FLEX JOINT SHIELD PO) Take 2 tablets by mouth daily. Triple Strength with Turmeric    ? Multiple Vitamins-Minerals (PRESERVISION AREDS 2) CAPS SMARTSIG:1 By Mouth    ? Polyvinyl Alcohol-Povidone PF (REFRESH) 1.4-0.6 %  SOLN Place 1 drop into both eyes daily as needed (Dry eye).    ? triamterene-hydrochlorothiazide (DYAZIDE) 37.5-25 MG capsule Take 1 each (1 capsule total) by mouth every morning. 90 capsule 1  ? ?No facility-administered medications prior to visit.  ? ? ?Allergies  ?Allergen Reactions  ? Sulfa Antibiotics Anaphylaxis  ? Other   ?  Pain meds (cannot tolerate NARCOTICS)   ? Stadol [Butorphanol]   ?  Vomiting and excessive drowsiness  ? Codeine Palpitations  ? ?ROS neg/noncontributory except as noted HPI/below ?Breathing better w/inhaler ?Back-status quo.  Is walking to neighbor and back.  ? ? ?   ?Objective:  ?  ? ?BP 120/60   Pulse 74   Temp 98.7 ?F (37.1 ?C) (Temporal)   Ht '4\' 11"'$  (1.499 m)   Wt 151 lb 6 oz (68.7 kg)   SpO2 96%   BMI 30.57 kg/m?  ?Wt Readings from Last 3 Encounters:  ?08/19/21  151 lb 6 oz (68.7 kg)  ?07/22/21 154 lb 8 oz (70.1 kg)  ?10/29/20 154 lb (69.9 kg)  ? ? ?Physical Exam  ? ?Gen: WDWN NAD ?HEENT: NCAT, conjunctiva not injected, sclera nonicteric ?NECK:  supple, no thyromegaly, no nodes, no carotid bruits ?CARDIAC: RRR, S1S2+, no murmur. DP 2+B ?LUNGS: CTAB. No wheezes ?EXT:  1+ edema.  Legs tender ?MSK: no gross abnormalities.  ?NEURO: A&O x3.  CN II-XII intact.  ?PSYCH: normal mood. Good eye contact ? ?   ?Assessment & Plan:  ? ?Problem List Items Addressed This Visit   ? ?  ? Other  ? Depression  ? Localized edema - Primary  ? Edema-chronic. Controlled on dyazide.  Continue.some leg cramps-check cbc,tsh,cmp,lipids,mg.  Stretches, drink water.  Inc K and mg foods ?Depression-not ideal.  Pt willing to inc dose but not add wellbutrin at this time.  F/u 1 mo ?Fh DM-check A1C ?Advised to get shingrix at pharm.  Check on dates of pneumovax ? ?No orders of the defined types were placed in this encounter. ? ? ?Wellington Hampshire, MD ? ?

## 2021-08-20 LAB — HEPATITIS C ANTIBODY
Hepatitis C Ab: NONREACTIVE
SIGNAL TO CUT-OFF: 0.14 (ref <1.00)

## 2021-08-31 DIAGNOSIS — M5416 Radiculopathy, lumbar region: Secondary | ICD-10-CM | POA: Diagnosis not present

## 2021-09-16 ENCOUNTER — Ambulatory Visit: Payer: Medicare PPO | Admitting: Family Medicine

## 2021-10-15 ENCOUNTER — Telehealth: Payer: Self-pay | Admitting: Family Medicine

## 2021-10-15 NOTE — Telephone Encounter (Signed)
Spoke with patient she req CB next week 10/19/21

## 2021-10-19 DIAGNOSIS — M4180 Other forms of scoliosis, site unspecified: Secondary | ICD-10-CM | POA: Diagnosis not present

## 2021-10-19 DIAGNOSIS — Z6830 Body mass index (BMI) 30.0-30.9, adult: Secondary | ICD-10-CM | POA: Diagnosis not present

## 2021-11-20 ENCOUNTER — Ambulatory Visit: Payer: Medicare PPO

## 2022-01-22 ENCOUNTER — Ambulatory Visit: Payer: Medicare PPO

## 2022-01-22 ENCOUNTER — Encounter: Payer: Self-pay | Admitting: Family Medicine

## 2022-01-22 ENCOUNTER — Ambulatory Visit: Payer: Medicare PPO | Admitting: Family Medicine

## 2022-01-22 VITALS — BP 110/66 | HR 61 | Temp 97.6°F | Ht 59.0 in | Wt 146.0 lb

## 2022-01-22 DIAGNOSIS — F324 Major depressive disorder, single episode, in partial remission: Secondary | ICD-10-CM | POA: Diagnosis not present

## 2022-01-22 DIAGNOSIS — Z23 Encounter for immunization: Secondary | ICD-10-CM | POA: Diagnosis not present

## 2022-01-22 DIAGNOSIS — R600 Localized edema: Secondary | ICD-10-CM

## 2022-01-22 DIAGNOSIS — J4530 Mild persistent asthma, uncomplicated: Secondary | ICD-10-CM

## 2022-01-22 LAB — MAGNESIUM: Magnesium: 1.9 mg/dL (ref 1.5–2.5)

## 2022-01-22 LAB — CBC WITH DIFFERENTIAL/PLATELET
Basophils Absolute: 0 10*3/uL (ref 0.0–0.1)
Basophils Relative: 0.7 % (ref 0.0–3.0)
Eosinophils Absolute: 0 10*3/uL (ref 0.0–0.7)
Eosinophils Relative: 0.9 % (ref 0.0–5.0)
HCT: 40 % (ref 36.0–46.0)
Hemoglobin: 13.5 g/dL (ref 12.0–15.0)
Lymphocytes Relative: 42.1 % (ref 12.0–46.0)
Lymphs Abs: 2 10*3/uL (ref 0.7–4.0)
MCHC: 33.7 g/dL (ref 30.0–36.0)
MCV: 96.8 fl (ref 78.0–100.0)
Monocytes Absolute: 0.5 10*3/uL (ref 0.1–1.0)
Monocytes Relative: 10.8 % (ref 3.0–12.0)
Neutro Abs: 2.2 10*3/uL (ref 1.4–7.7)
Neutrophils Relative %: 45.5 % (ref 43.0–77.0)
Platelets: 278 10*3/uL (ref 150.0–400.0)
RBC: 4.14 Mil/uL (ref 3.87–5.11)
RDW: 12.8 % (ref 11.5–15.5)
WBC: 4.8 10*3/uL (ref 4.0–10.5)

## 2022-01-22 LAB — COMPREHENSIVE METABOLIC PANEL
ALT: 13 U/L (ref 0–35)
AST: 17 U/L (ref 0–37)
Albumin: 4 g/dL (ref 3.5–5.2)
Alkaline Phosphatase: 83 U/L (ref 39–117)
BUN: 10 mg/dL (ref 6–23)
CO2: 34 mEq/L — ABNORMAL HIGH (ref 19–32)
Calcium: 9.7 mg/dL (ref 8.4–10.5)
Chloride: 101 mEq/L (ref 96–112)
Creatinine, Ser: 0.86 mg/dL (ref 0.40–1.20)
GFR: 66.13 mL/min (ref >60.00)
Glucose, Bld: 94 mg/dL (ref 70–99)
Potassium: 4.8 mEq/L (ref 3.5–5.1)
Sodium: 141 mEq/L (ref 135–145)
Total Bilirubin: 0.6 mg/dL (ref 0.2–1.2)
Total Protein: 6.6 g/dL (ref 6.0–8.3)

## 2022-01-22 MED ORDER — ESCITALOPRAM OXALATE 10 MG PO TABS: 10.0000 mg | ORAL_TABLET | Freq: Every day | ORAL | 1 refills | Status: DC

## 2022-01-22 MED ORDER — TRIAMTERENE-HCTZ 37.5-25 MG PO CAPS: 1.0000 | ORAL_CAPSULE | Freq: Every morning | ORAL | 1 refills | Status: DC

## 2022-01-22 NOTE — Progress Notes (Signed)
Subjective:     Patient ID: Ariel Cook, female    DOB: 03/31/1946, 76 y.o.   MRN: [id number]  Chief Complaint  Patient presents with   Follow-up    Follow-up on depression     HPI 1   Depression-lexapro helps. Still some bad days.  Doesn't sleep well. Occ takes tylenol arthritis. Tylenol pm-nightmares.  2.  Edema-dyazide daily helps.  Elevates legs.  Not wearing compression stockings as swells higher.   3.  Asthma-flaring d/t weather.  Coughing up clear mucus. Wasn't taking flovent, but back on.  Not needing albuterol.  4.  Spot roof of mouth x 3 wks-made whole mouth sore. Getting better.  There are no preventive care reminders to display for this patient.  Past Medical History:  Diagnosis Date   Allergy Always   Anemia Youth   Anxiety    Asthma    USES ALBUTEROL INHALER   Cataract 2011   Surgery   DDD (degenerative disc disease), cervical    Depression Chronic   GERD (gastroesophageal reflux disease) Ongoing   Headache(784.0)    Left carpal tunnel syndrome 09/03/2011   Osteoporosis    PONV (postoperative nausea and vomiting)    Sciatica    Unspecified adverse effect of anesthesia    CAUSES ASTHMA TO EXACERBATE, CAUSES MIGRAINES    Past Surgical History:  Procedure Laterality Date   APPENDECTOMY  2010   CARPAL TUNNEL RELEASE  2010   RT   CARPAL TUNNEL RELEASE  09/03/2011   Procedure: CARPAL TUNNEL RELEASE;  Surgeon: Johnny Bridge, MD;  Location: [place];  Service: Orthopedics;  Laterality: Left;   COLONOSCOPY N/A 04/03/2020   Procedure: COLONOSCOPY;  Surgeon: Rogene Houston, MD;  [facility];  Service: Endoscopy;  Laterality: N/A;  730, per office moved to 12/2 @ 8:30   EYE SURGERY  2011   BIL CATARACT   POLYPECTOMY  04/03/2020   Procedure: POLYPECTOMY INTESTINAL;  Surgeon: Rogene Houston, MD;  [facility];  Service: Endoscopy;;   TUBAL LIGATION  1976    Outpatient Medications Prior to Visit  Medication Sig  Dispense Refill   acetaminophen (TYLENOL) 650 MG CR tablet Take 650 mg by mouth every 8 (eight) hours as needed for pain.     albuterol (VENTOLIN HFA) 108 (90 Base) MCG/ACT inhaler Inhale 2 puffs into the lungs every 6 (six) hours as needed for wheezing or shortness of breath. 18 g 1   Biotin 10000 MCG TABS Take 10,000 mcg by mouth daily.      cyclobenzaprine (FLEXERIL) 10 MG tablet Take 10 mg by mouth 2 (two) times daily as needed.     fluticasone (FLOVENT HFA) 110 MCG/ACT inhaler Inhale 2 puffs into the lungs 2 (two) times daily. 3 each 3   ibuprofen (ADVIL) 200 MG tablet Take 600 mg by mouth every 6 (six) hours as needed.     lidocaine (LIDODERM) 5 % Place 1 patch onto the skin daily. Remove & Discard patch within 12 hours or as directed by MD 30 patch 0   Misc Natural Products (OSTEO BI-FLEX JOINT SHIELD PO) Take 2 tablets by mouth daily. Triple Strength with Turmeric     Multiple Vitamins-Minerals (PRESERVISION AREDS 2) CAPS SMARTSIG:1 By Mouth     Polyvinyl Alcohol-Povidone PF (REFRESH) 1.4-0.6 % SOLN Place 1 drop into both eyes daily as needed (Dry eye).     escitalopram (LEXAPRO) 10 MG tablet Take 1 tablet (10 mg total) by  mouth daily. 90 tablet 1   triamterene-hydrochlorothiazide (DYAZIDE) 37.5-25 MG capsule Take 1 each (1 capsule total) by mouth every morning. 90 capsule 1   No facility-administered medications prior to visit.    Allergies  Allergen Reactions   Sulfa Antibiotics Anaphylaxis   Other     Pain meds (cannot tolerate NARCOTICS)    Stadol [Butorphanol]     Vomiting and excessive drowsiness   Codeine Palpitations   ROS neg/noncontributory except as noted HPI/below  Bruising easily.       Objective:     BP 110/66   Pulse 61   Temp 97.6 F (36.4 C) (Temporal)   Ht '4\' 11"'$  (1.499 m)   Wt 146 lb (66.2 kg)   SpO2 99%   BMI 29.49 kg/m  Wt Readings from Last 3 Encounters:  01/22/22 146 lb (66.2 kg)  08/19/21 151 lb 6 oz (68.7 kg)  07/22/21 154 lb 8 oz (70.1  kg)    Physical Exam   Gen: WDWN NAD HEENT: NCAT, conjunctiva not injected, sclera nonicteric NECK:  supple, no thyromegaly, no nodes, no carotid bruits CARDIAC: RRR, S1S2+, no murmur. DP 2+B LUNGS: CTAB. No wheezes ABDOMEN:  BS+, soft, NTND, No HSM, no masses EXT:  no edema MSK: hard to get up and down d/t back  NEURO: A&O x3.  CN II-XII intact.  PSYCH: normal mood. Good eye contact     Assessment & Plan:   Problem List Items Addressed This Visit       Respiratory   Mild persistent asthma without complication     Other   Depression   Relevant Medications   escitalopram (LEXAPRO) 10 MG tablet   Localized edema - Primary   Relevant Orders   Comprehensive metabolic panel   CBC with Differential/Platelet   Magnesium   Other Visit Diagnoses     Need for immunization against influenza       Relevant Orders   Flu Vaccine QUAD High Dose(Fluad) (Completed)      Edema-controlled on dyazide 37.5/25.  cont.  Check cbc,cmp,Mg d/t meds Depression-mostly controlled on lexapro '10mg'$ -cont. Asthma-takes meds intermitt.  Started to flare so back on flovent.   F/u 10m Meds ordered this encounter  Medications   escitalopram (LEXAPRO) 10 MG tablet    Sig: Take 1 tablet (10 mg total) by mouth daily.    Dispense:  90 tablet    Refill:  1   triamterene-hydrochlorothiazide (DYAZIDE) 37.5-25 MG capsule    Sig: Take 1 each (1 capsule total) by mouth every morning.    Dispense:  90 capsule    Refill:  1    AWellington Hampshire MD

## 2022-01-22 NOTE — Patient Instructions (Addendum)
It was very nice to see you today!  Can try gabapentin at bedtime to help with sleep.  Can try 1/2 tab of the flexeril.   Get RSV immunization at pharmacy.   PLEASE NOTE:  If you had any lab tests please let us know if you have not heard back within a few days. You may see your results on MyChart before we have a chance to review them but we will give you a call once they are reviewed by [place]. If we ordered any referrals today, please let us know if you have not heard from their office within the next week.   Please try these tips to maintain a healthy lifestyle:  Eat most of your calories during the day when you are active. Eliminate processed foods including packaged sweets (pies, cakes, cookies), reduce intake of potatoes, white bread, white pasta, and white rice. Look for whole grain options, oat flour or almond flour.  Each meal should contain half fruits/vegetables, one quarter protein, and one quarter carbs (no bigger than a computer mouse).  Cut down on sweet beverages. This includes juice, soda, and sweet tea. Also watch fruit intake, though this is a healthier sweet option, it still contains natural sugar! Limit to 3 servings daily.  Drink at least 1 glass of water with each meal and aim for at least 8 glasses per day  Exercise at least 150 minutes every week.

## 2022-02-02 DIAGNOSIS — D485 Neoplasm of uncertain behavior of skin: Secondary | ICD-10-CM | POA: Diagnosis not present

## 2022-02-02 DIAGNOSIS — L821 Other seborrheic keratosis: Secondary | ICD-10-CM | POA: Diagnosis not present

## 2022-02-02 DIAGNOSIS — L820 Inflamed seborrheic keratosis: Secondary | ICD-10-CM | POA: Diagnosis not present

## 2022-02-02 DIAGNOSIS — L570 Actinic keratosis: Secondary | ICD-10-CM | POA: Diagnosis not present

## 2022-02-02 DIAGNOSIS — L245 Irritant contact dermatitis due to other chemical products: Secondary | ICD-10-CM | POA: Diagnosis not present

## 2022-02-02 DIAGNOSIS — Z85828 Personal history of other malignant neoplasm of skin: Secondary | ICD-10-CM | POA: Diagnosis not present

## 2022-02-11 ENCOUNTER — Encounter (INDEPENDENT_AMBULATORY_CARE_PROVIDER_SITE_OTHER): Payer: Medicare PPO | Admitting: Ophthalmology

## 2022-02-11 DIAGNOSIS — D3132 Benign neoplasm of left choroid: Secondary | ICD-10-CM | POA: Diagnosis not present

## 2022-02-11 DIAGNOSIS — H353132 Nonexudative age-related macular degeneration, bilateral, intermediate dry stage: Secondary | ICD-10-CM

## 2022-02-11 DIAGNOSIS — H43813 Vitreous degeneration, bilateral: Secondary | ICD-10-CM

## 2022-02-23 DIAGNOSIS — U071 COVID-19: Secondary | ICD-10-CM | POA: Diagnosis not present

## 2022-02-23 DIAGNOSIS — Z20822 Contact with and (suspected) exposure to covid-19: Secondary | ICD-10-CM | POA: Diagnosis not present

## 2022-02-23 DIAGNOSIS — R519 Headache, unspecified: Secondary | ICD-10-CM | POA: Diagnosis not present

## 2022-04-06 DIAGNOSIS — L821 Other seborrheic keratosis: Secondary | ICD-10-CM | POA: Diagnosis not present

## 2022-04-06 DIAGNOSIS — L738 Other specified follicular disorders: Secondary | ICD-10-CM | POA: Diagnosis not present

## 2022-04-06 DIAGNOSIS — D2271 Melanocytic nevi of right lower limb, including hip: Secondary | ICD-10-CM | POA: Diagnosis not present

## 2022-04-06 DIAGNOSIS — Z85828 Personal history of other malignant neoplasm of skin: Secondary | ICD-10-CM | POA: Diagnosis not present

## 2022-04-20 DIAGNOSIS — H10413 Chronic giant papillary conjunctivitis, bilateral: Secondary | ICD-10-CM | POA: Diagnosis not present

## 2022-04-20 DIAGNOSIS — H0102B Squamous blepharitis left eye, upper and lower eyelids: Secondary | ICD-10-CM | POA: Diagnosis not present

## 2022-04-20 DIAGNOSIS — D3132 Benign neoplasm of left choroid: Secondary | ICD-10-CM | POA: Diagnosis not present

## 2022-04-20 DIAGNOSIS — H353132 Nonexudative age-related macular degeneration, bilateral, intermediate dry stage: Secondary | ICD-10-CM | POA: Diagnosis not present

## 2022-04-20 DIAGNOSIS — H0102A Squamous blepharitis right eye, upper and lower eyelids: Secondary | ICD-10-CM | POA: Diagnosis not present

## 2022-04-20 DIAGNOSIS — H532 Diplopia: Secondary | ICD-10-CM | POA: Diagnosis not present

## 2022-04-20 DIAGNOSIS — H16213 Exposure keratoconjunctivitis, bilateral: Secondary | ICD-10-CM | POA: Diagnosis not present

## 2022-04-20 DIAGNOSIS — H04123 Dry eye syndrome of bilateral lacrimal glands: Secondary | ICD-10-CM | POA: Diagnosis not present

## 2022-05-11 ENCOUNTER — Ambulatory Visit (INDEPENDENT_AMBULATORY_CARE_PROVIDER_SITE_OTHER): Payer: Medicare PPO

## 2022-05-11 VITALS — Wt 140.0 lb

## 2022-05-11 DIAGNOSIS — Z0000 Encounter for general adult medical examination without abnormal findings: Secondary | ICD-10-CM

## 2022-05-11 DIAGNOSIS — Z1231 Encounter for screening mammogram for malignant neoplasm of breast: Secondary | ICD-10-CM | POA: Diagnosis not present

## 2022-05-11 NOTE — Patient Instructions (Signed)
Ariel Cook , Thank you for taking time to come for your Medicare Wellness Visit. I appreciate your ongoing commitment to your health goals. Please review the following plan we discussed and let me know if I can assist you in the future.   These are the goals we discussed:  Goals      Patient Stated     Keep losing weight         This is a list of the screening recommended for you and due dates:  Health Maintenance  Topic Date Due   Zoster (Shingles) Vaccine (1 of 2) Never done   DTaP/Tdap/Td vaccine (2 - Tdap) 03/21/2007   Pneumonia Vaccine (2 - PPSV23 or PCV20) 03/10/2019   COVID-19 Vaccine (5 - 2023-24 season) 01/01/2022   Medicare Annual Wellness Visit  05/12/2023   Colon Cancer Screening  04/04/2027   Flu Shot  Completed   DEXA scan (bone density measurement)  Completed   Hepatitis C Screening: USPSTF Recommendation to screen - Ages 18-79 yo.  Completed   HPV Vaccine  Aged Out    Advanced directives: Please bring a copy of your health care power of attorney and living will to the office at your convenience.  Conditions/risks identified: keep losing weight   Next appointment: Follow up in one year for your annual wellness visit    Preventive Care 65 Years and Older, Female Preventive care refers to lifestyle choices and visits with your health care provider that can promote health and wellness. What does preventive care include? A yearly physical exam. This is also called an annual well check. Dental exams once or twice a year. Routine eye exams. Ask your health care provider how often you should have your eyes checked. Personal lifestyle choices, including: Daily care of your teeth and gums. Regular physical activity. Eating a healthy diet. Avoiding tobacco and drug use. Limiting alcohol use. Practicing safe sex. Taking low-dose aspirin every day. Taking vitamin and mineral supplements as recommended by your health care provider. What happens during an annual well  check? The services and screenings done by your health care provider during your annual well check will depend on your age, overall health, lifestyle risk factors, and family history of disease. Counseling  Your health care provider may ask you questions about your: Alcohol use. Tobacco use. Drug use. Emotional well-being. Home and relationship well-being. Sexual activity. Eating habits. History of falls. Memory and ability to understand (cognition). Work and work Statistician. Reproductive health. Screening  You may have the following tests or measurements: Height, weight, and BMI. Blood pressure. Lipid and cholesterol levels. These may be checked every 5 years, or more frequently if you are over 38 years old. Skin check. Lung cancer screening. You may have this screening every year starting at age 71 if you have a 30-pack-year history of smoking and currently smoke or have quit within the past 15 years. Fecal occult blood test (FOBT) of the stool. You may have this test every year starting at age 7. Flexible sigmoidoscopy or colonoscopy. You may have a sigmoidoscopy every 5 years or a colonoscopy every 10 years starting at age 89. Hepatitis C blood test. Hepatitis B blood test. Sexually transmitted disease (STD) testing. Diabetes screening. This is done by checking your blood sugar (glucose) after you have not eaten for a while (fasting). You may have this done every 1-3 years. Bone density scan. This is done to screen for osteoporosis. You may have this done starting at age 69. Mammogram. This  may be done every 1-2 years. Talk to your health care provider about how often you should have regular mammograms. Talk with your health care provider about your test results, treatment options, and if necessary, the need for more tests. Vaccines  Your health care provider may recommend certain vaccines, such as: Influenza vaccine. This is recommended every year. Tetanus, diphtheria, and  acellular pertussis (Tdap, Td) vaccine. You may need a Td booster every 10 years. Zoster vaccine. You may need this after age 49. Pneumococcal 13-valent conjugate (PCV13) vaccine. One dose is recommended after age 76. Pneumococcal polysaccharide (PPSV23) vaccine. One dose is recommended after age 86. Talk to your health care provider about which screenings and vaccines you need and how often you need them. This information is not intended to replace advice given to you by your health care provider. Make sure you discuss any questions you have with your health care provider. Document Released: 05/16/2015 Document Revised: 01/07/2016 Document Reviewed: 02/18/2015 Elsevier Interactive Patient Education  2017 [employer] Prevention in the Home Falls can cause injuries. They can happen to people of all ages. There are many things you can do to make your home safe and to help prevent falls. What can I do on the outside of my home? Regularly fix the edges of walkways and driveways and fix any cracks. Remove anything that might make you trip as you walk through a door, such as a raised step or threshold. Trim any bushes or trees on the path to your home. Use bright outdoor lighting. Clear any walking paths of anything that might make someone trip, such as rocks or tools. Regularly check to see if handrails are loose or broken. Make sure that both sides of any steps have handrails. Any raised decks and porches should have guardrails on the edges. Have any leaves, snow, or ice cleared regularly. Use sand or salt on walking paths during winter. Clean up any spills in your garage right away. This includes oil or grease spills. What can I do in the bathroom? Use night lights. Install grab bars by the toilet and in the tub and shower. Do not use towel bars as grab bars. Use non-skid mats or decals in the tub or shower. If you need to sit down in the shower, use a plastic, non-slip stool. Keep  the floor dry. Clean up any water that spills on the floor as soon as it happens. Remove soap buildup in the tub or shower regularly. Attach bath mats securely with double-sided non-slip rug tape. Do not have throw rugs and other things on the floor that can make you trip. What can I do in the bedroom? Use night lights. Make sure that you have a light by your bed that is easy to reach. Do not use any sheets or blankets that are too big for your bed. They should not hang down onto the floor. Have a firm chair that has side arms. You can use this for support while you get dressed. Do not have throw rugs and other things on the floor that can make you trip. What can I do in the kitchen? Clean up any spills right away. Avoid walking on wet floors. Keep items that you use a lot in easy-to-reach places. If you need to reach something above you, use a strong step stool that has a grab bar. Keep electrical cords out of the way. Do not use floor polish or wax that makes floors slippery. If you must  use wax, use non-skid floor wax. Do not have throw rugs and other things on the floor that can make you trip. What can I do with my stairs? Do not leave any items on the stairs. Make sure that there are handrails on both sides of the stairs and use them. Fix handrails that are broken or loose. Make sure that handrails are as long as the stairways. Check any carpeting to make sure that it is firmly attached to the stairs. Fix any carpet that is loose or worn. Avoid having throw rugs at the top or bottom of the stairs. If you do have throw rugs, attach them to the floor with carpet tape. Make sure that you have a light switch at the top of the stairs and the bottom of the stairs. If you do not have them, ask someone to add them for you. What else can I do to help prevent falls? Wear shoes that: Do not have high heels. Have rubber bottoms. Are comfortable and fit you well. Are closed at the toe. Do not  wear sandals. If you use a stepladder: Make sure that it is fully opened. Do not climb a closed stepladder. Make sure that both sides of the stepladder are locked into place. Ask someone to hold it for you, if possible. Clearly mark and make sure that you can see: Any grab bars or handrails. First and last steps. Where the edge of each step is. Use tools that help you move around (mobility aids) if they are needed. These include: Canes. Walkers. Scooters. Crutches. Turn on the lights when you go into a dark area. Replace any light bulbs as soon as they burn out. Set up your furniture so you have a clear path. Avoid moving your furniture around. If any of your floors are uneven, fix them. If there are any pets around you, be aware of where they are. Review your medicines with your doctor. Some medicines can make you feel dizzy. This can increase your chance of falling. Ask your doctor what other things that you can do to help prevent falls. This information is not intended to replace advice given to you by your health care provider. Make sure you discuss any questions you have with your health care provider. Document Released: 02/13/2009 Document Revised: 09/25/2015 Document Reviewed: 05/24/2014 Elsevier Interactive Patient Education  2017 [employer].

## 2022-05-11 NOTE — Progress Notes (Signed)
I connected with  [place] on 05/11/22 by a audio enabled telemedicine application and verified that I am speaking with the correct person using two identifiers.  Patient Location: Home  Provider Location: Office/Clinic  I discussed the limitations of evaluation and management by telemedicine. The patient expressed understanding and agreed to proceed.  Subjective:   YANESSA HOCEVAR is a 77 y.o. female who presents for Medicare Annual (Subsequent) preventive examination.  Review of Systems     Cardiac Risk Factors include: advanced age (>100mn, >>62women)     Objective:    Today's Vitals   05/11/22 1031  Weight: 140 lb (63.5 kg)   Body mass index is 28.28 kg/m.     05/11/2022   10:40 AM 03/02/2021    1:20 PM 10/29/2020    4:29 PM 12/03/2016    6:28 PM 09/01/2011   10:46 AM  Advanced Directives  Does Patient Have a Medical Advance Directive? Yes Yes No No Patient does not have advance directive  Type of [place] Directive [place]      Does patient want to make changes to medical advance directive?  No - Patient declined     [place]? No - copy requested        Current Medications (verified) Outpatient Encounter Medications as of 05/11/2022  Medication Sig   acetaminophen (TYLENOL) 650 MG CR tablet Take 650 mg by mouth every 8 (eight) hours as needed for pain.   albuterol (VENTOLIN HFA) 108 (90 Base) MCG/ACT inhaler Inhale 2 puffs into the lungs every 6 (six) hours as needed for wheezing or shortness of breath.   Biotin 10000 MCG TABS Take 10,000 mcg by mouth daily.    cyclobenzaprine (FLEXERIL) 10 MG tablet Take 10 mg by mouth 2 (two) times daily as needed.   escitalopram (LEXAPRO) 10 MG tablet Take 1 tablet (10 mg total) by mouth daily.   fluticasone (FLOVENT HFA) 110 MCG/ACT inhaler Inhale 2 puffs into the lungs 2 (two) times daily.   ibuprofen (ADVIL) 200 MG tablet Take 600 mg by mouth every 6 (six)  hours as needed.   lidocaine (LIDODERM) 5 % Place 1 patch onto the skin daily. Remove & Discard patch within 12 hours or as directed by MD   Misc Natural Products (OSTEO BI-FLEX JOINT SHIELD PO) Take 2 tablets by mouth daily. Triple Strength with Turmeric   Multiple Vitamins-Minerals (PRESERVISION AREDS 2) CAPS SMARTSIG:1 By Mouth   Polyvinyl Alcohol-Povidone PF (REFRESH) 1.4-0.6 % SOLN Place 1 drop into both eyes daily as needed (Dry eye).   triamterene-hydrochlorothiazide (DYAZIDE) 37.5-25 MG capsule Take 1 each (1 capsule total) by mouth every morning.   No facility-administered encounter medications on file as of 05/11/2022.    Allergies (verified) Sulfa antibiotics, Other, Stadol [butorphanol], and Codeine   History: Past Medical History:  Diagnosis Date   Allergy Always   Anemia Youth   Anxiety    Asthma    USES ALBUTEROL INHALER   Cataract 2011   Surgery   DDD (degenerative disc disease), cervical    Depression Chronic   GERD (gastroesophageal reflux disease) Ongoing   Headache(784.0)    Left carpal tunnel syndrome 09/03/2011   Osteoporosis    PONV (postoperative nausea and vomiting)    Sciatica    Unspecified adverse effect of anesthesia    CAUSES ASTHMA TO EXACERBATE, CAUSES MIGRAINES   Past Surgical History:  Procedure Laterality Date   APPENDECTOMY  2010  CARPAL TUNNEL RELEASE  2010   RT   CARPAL TUNNEL RELEASE  09/03/2011   Procedure: CARPAL TUNNEL RELEASE;  Surgeon: Johnny Bridge, MD;  Location: [facility];  Service: Orthopedics;  Laterality: Left;   COLONOSCOPY N/A 04/03/2020   Procedure: COLONOSCOPY;  Surgeon: Rogene Houston, MD;  [facility];  Service: Endoscopy;  Laterality: N/A;  46, per office moved to 12/2 @ 8:30   EYE SURGERY  2011   BIL CATARACT   POLYPECTOMY  04/03/2020   Procedure: POLYPECTOMY INTESTINAL;  Surgeon: Rogene Houston, MD;  [facility];  Service: Endoscopy;;   TUBAL LIGATION  1976   Family  History  Problem Relation Age of Onset   Heart disease Mother    COPD Mother    Glaucoma Mother    Hypertension Mother    Heart attack Father    Arthritis Father    COPD Father    Heart disease Father    Hypertension Father    Stroke Sister    Intellectual disability Sister    Hypertension Sister    Hyperlipidemia Sister    Early death Sister    Drug abuse Sister    COPD Sister    Arthritis Sister    Retinal detachment Sister    Diabetes Sister    Breast cancer Sister    Cancer Sister 27       breast   Depression Sister    Kidney disease Sister    Obesity Sister    Cancer Sister 53       breast   Learning disabilities Son    Birth defects Son    Intellectual disability Son    Mental retardation Son    Diabetes Maternal Aunt    Diabetes Maternal Uncle    Hyperlipidemia Maternal Grandmother    Depression Maternal Grandmother    Arthritis Maternal Grandmother    Varicose Veins Maternal Grandmother    Birth defects Maternal Grandfather    Arthritis Maternal Grandfather    Hyperlipidemia Paternal Grandmother    Heart disease Paternal Grandmother    Arthritis Paternal Grandmother    Arthritis Paternal Grandfather    Vision loss Paternal Grandfather    Social History   Socioeconomic History   Marital status: Widowed    Spouse name: Not on file   Number of children: 2   Years of education: Not on file   Highest education level: Not on file  Occupational History   Not on file  Tobacco Use   Smoking status: Never   Smokeless tobacco: Never  Vaping Use   Vaping Use: Never used  Substance and Sexual Activity   Alcohol use: No   Drug use: No   Sexual activity: Not Currently  Other Topics Concern   Not on file  Social History Narrative   Not on file   Social Determinants of Health   Financial Resource Strain: Low Risk  (05/11/2022)   Overall Financial Resource Strain (CARDIA)    Difficulty of Paying Living Expenses: Not hard at all  Food Insecurity: No Food  Insecurity (05/11/2022)   Hunger Vital Sign    Worried About Running Out of Food in the Last Year: Never true    Ran Out of Food in the Last Year: Never true  Transportation Needs: No Transportation Needs (05/11/2022)   PRAPARE - Hydrologist (Medical): No    Lack of Transportation (Non-Medical): No  Physical Activity: Inactive (05/11/2022)  Exercise Vital Sign    Days of Exercise per Week: 0 days    Minutes of Exercise per Session: 0 min  Stress: No Stress Concern Present (05/11/2022)   [place]    Feeling of Stress : Only a little  Social Connections: Moderately Isolated (05/11/2022)   Social Connection and Isolation Panel [NHANES]    Frequency of Communication with Friends and Family: More than three times a week    Frequency of Social Gatherings with Friends and Family: Twice a week    Attends Religious Services: Never    Marine scientist or Organizations: Yes    Attends Archivist Meetings: 1 to 4 times per year    Marital Status: Widowed    Tobacco Counseling Counseling given: Not Answered   Clinical Intake:  Pre-visit preparation completed: Yes  Pain : No/denies pain     BMI - recorded: 28.28 Nutritional Status: BMI 25 -29 Overweight Nutritional Risks: None Diabetes: No  How often do you need to have someone help you when you read instructions, pamphlets, or other written materials from your doctor or pharmacy?: 1 - Never  Diabetic?no  Interpreter Needed?: No  Information entered by :: Charlott Rakes, LPN   Activities of Daily Living    05/11/2022   10:41 AM  In your present state of health, do you have any difficulty performing the following activities:  Hearing? 1  Comment wearing hearing aids  Vision? 0  Difficulty concentrating or making decisions? 0  Walking or climbing stairs? 0  Dressing or bathing? 0  Doing errands, shopping? 0  Preparing  Food and eating ? N  Using the Toilet? N  In the past six months, have you accidently leaked urine? Y  Comment a pad with sneezing  Do you have problems with loss of bowel control? N  Managing your Medications? N  Managing your Finances? N  Housekeeping or managing your Housekeeping? N    Patient Care Team: Tawnya Crook, MD as PCP - General (Family Medicine)  Indicate any recent Medical Services you may have received from other than [facility] providers in the past year (date may be approximate).     Assessment:   This is a routine wellness examination for [place].  Hearing/Vision screen Hearing Screening - Comments:: Pt wears hearing aids  Vision Screening - Comments:: Pt follows up with Dr Katy Fitch for annual eye exams   Dietary issues and exercise activities discussed: Current Exercise Habits: The patient does not participate in regular exercise at present   Goals Addressed             This Visit's Progress    Patient Stated       Keep losing weight        Depression Screen    05/11/2022   10:35 AM 01/22/2022    9:34 AM 07/22/2021    2:56 PM 10/07/2020    1:18 PM 08/09/2019    9:13 AM 08/07/2019    1:48 PM 02/24/2017    8:46 AM  PHQ 2/9 Scores  PHQ - 2 Score '2 2 6 '$ 0 '6 6 4  '$ PHQ- 9 Score '13 16 22  23 23 19    '$ Fall Risk    05/11/2022   10:41 AM 07/22/2021    2:15 PM 10/07/2020    1:16 PM 09/04/2019    9:37 AM 08/24/2019   11:04 AM  Fall Risk   Falls in the past year? 0  0 0 1 0  Number falls in past yr: 0 0  0   Injury with Fall? 0 0  0   Risk for fall due to : Impaired vision Impaired mobility;Impaired balance/gait History of fall(s)    Follow up Falls prevention discussed Falls evaluation completed;Education provided;Falls prevention discussed  Falls evaluation completed     FALL RISK PREVENTION PERTAINING TO THE HOME:  Any stairs in or around the home? Yes  If so, are there any without handrails? No  Home free of loose throw rugs in walkways, pet beds, electrical  cords, etc? Yes  Adequate lighting in your home to reduce risk of falls? Yes   ASSISTIVE DEVICES UTILIZED TO PREVENT FALLS:  Life alert? No  Use of a cane, walker or w/c? No  Grab bars in the bathroom? No  Shower chair or bench in shower? No  Elevated toilet seat or a handicapped toilet? No   TIMED UP AND GO:  Was the test performed? No .  Cognitive Function:        05/11/2022   10:42 AM  6CIT Screen  What Year? 0 points  What month? 0 points  What time? 0 points  Count back from 20 0 points  Months in reverse 0 points  Repeat phrase 0 points  Total Score 0 points    Immunizations Immunization History  Administered Date(s) Administered   Fluad Quad(high Dose 65+) 01/22/2022   Influenza Split 03/22/2013   Influenza, High Dose Seasonal PF 05/12/2018   Influenza,inj,Quad PF,6+ Mos 02/07/2014, 04/19/2016, 02/24/2017   Influenza-Unspecified 02/14/2021   Moderna Covid-19 Vaccine Bivalent Booster 56yr & up 04/06/2021   Moderna Sars-Covid-2 Vaccination 06/16/2019, 07/15/2019, 03/16/2020   Pneumococcal Conjugate-13 03/09/2018   Td 03/20/1997    TDAP status: Due, Education has been provided regarding the importance of this vaccine. Advised may receive this vaccine at local pharmacy or Health Dept. Aware to provide a copy of the vaccination record if obtained from local pharmacy or Health Dept. Verbalized acceptance and understanding.  Flu Vaccine status: Up to date  Pneumococcal vaccine status: Due, Education has been provided regarding the importance of this vaccine. Advised may receive this vaccine at local pharmacy or Health Dept. Aware to provide a copy of the vaccination record if obtained from local pharmacy or Health Dept. Verbalized acceptance and understanding.  Covid-19 vaccine status: Completed vaccines  Qualifies for Shingles Vaccine? Yes   Zostavax completed No   Shingrix Completed?: No.    Education has been provided regarding the importance of this vaccine.  Patient has been advised to call insurance company to determine out of pocket expense if they have not yet received this vaccine. Advised may also receive vaccine at local pharmacy or Health Dept. Verbalized acceptance and understanding.  Screening Tests Health Maintenance  Topic Date Due   Zoster Vaccines- Shingrix (1 of 2) Never done   DTaP/Tdap/Td (2 - Tdap) 03/21/2007   Pneumonia Vaccine 76 Years old (2 - PPSV23 or PCV20) 03/10/2019   COVID-19 Vaccine (5 - 2023-24 season) 01/01/2022   Medicare Annual Wellness (AWV)  05/12/2023   COLONOSCOPY (Pts 45-467yrInsurance coverage will need to be confirmed)  04/04/2027   INFLUENZA VACCINE  Completed   DEXA SCAN  Completed   Hepatitis C Screening  Completed   HPV VACCINES  Aged Out    Health Maintenance  Health Maintenance Due  Topic Date Due   Zoster Vaccines- Shingrix (1 of 2) Never done   DTaP/Tdap/Td (2 - Tdap) 03/21/2007  Pneumonia Vaccine 12+ Years old (2 - PPSV23 or PCV20) 03/10/2019   COVID-19 Vaccine (5 - 2023-24 season) 01/01/2022    Colorectal cancer screening: Type of screening: Colonoscopy. Completed 04/03/20. Repeat every 7 years  Mammogram status: Ordered 05/11/22. Pt provided with contact info and advised to call to schedule appt.   Bone density 10/27/01   Additional Screening:  Hepatitis C Screening:  Completed 08/19/21  Vision Screening: Recommended annual ophthalmology exams for early detection of glaucoma and other disorders of the eye. Is the patient up to date with their annual eye exam?  Yes  Who is the provider or what is the name of the office in which the patient attends annual eye exams? Dr Katy Fitch  If pt is not established with a provider, would they like to be referred to a provider to establish care? No .   Dental Screening: Recommended annual dental exams for proper oral hygiene  Community Resource Referral / Chronic Care Management: CRR required this visit?  No   CCM required this visit?  No       Plan:     I have personally reviewed and noted the following in the patient's chart:   Medical and social history Use of alcohol, tobacco or illicit drugs  Current medications and supplements including opioid prescriptions. Patient is not currently taking opioid prescriptions. Functional ability and status Nutritional status Physical activity Advanced directives List of other physicians Hospitalizations, surgeries, and ER visits in previous 12 months Vitals Screenings to include cognitive, depression, and falls Referrals and appointments  In addition, I have reviewed and discussed with patient certain preventive protocols, quality metrics, and best practice recommendations. A written personalized care plan for preventive services as well as general preventive health recommendations were provided to patient.     Willette Brace, LPN   05/08/1759   Nurse Notes: none

## 2022-05-11 NOTE — Addendum Note (Signed)
Addended by: Willette Brace on: 05/11/2022 03:53 PM   Modules accepted: Orders

## 2022-07-23 DIAGNOSIS — M5416 Radiculopathy, lumbar region: Secondary | ICD-10-CM | POA: Diagnosis not present

## 2022-08-18 DIAGNOSIS — M5416 Radiculopathy, lumbar region: Secondary | ICD-10-CM | POA: Diagnosis not present

## 2022-08-18 DIAGNOSIS — Z6828 Body mass index (BMI) 28.0-28.9, adult: Secondary | ICD-10-CM | POA: Diagnosis not present

## 2022-10-19 DIAGNOSIS — M5416 Radiculopathy, lumbar region: Secondary | ICD-10-CM | POA: Diagnosis not present

## 2022-12-03 DIAGNOSIS — M26623 Arthralgia of bilateral temporomandibular joint: Secondary | ICD-10-CM | POA: Diagnosis not present

## 2022-12-13 DIAGNOSIS — Z6828 Body mass index (BMI) 28.0-28.9, adult: Secondary | ICD-10-CM | POA: Diagnosis not present

## 2022-12-13 DIAGNOSIS — M4180 Other forms of scoliosis, site unspecified: Secondary | ICD-10-CM | POA: Diagnosis not present

## 2022-12-13 DIAGNOSIS — M47816 Spondylosis without myelopathy or radiculopathy, lumbar region: Secondary | ICD-10-CM | POA: Diagnosis not present

## 2022-12-28 DIAGNOSIS — M47816 Spondylosis without myelopathy or radiculopathy, lumbar region: Secondary | ICD-10-CM | POA: Diagnosis not present

## 2023-02-23 ENCOUNTER — Encounter (INDEPENDENT_AMBULATORY_CARE_PROVIDER_SITE_OTHER): Payer: Medicare PPO | Admitting: Ophthalmology

## 2023-02-23 DIAGNOSIS — D3132 Benign neoplasm of left choroid: Secondary | ICD-10-CM

## 2023-02-23 DIAGNOSIS — H353132 Nonexudative age-related macular degeneration, bilateral, intermediate dry stage: Secondary | ICD-10-CM

## 2023-02-23 DIAGNOSIS — H43813 Vitreous degeneration, bilateral: Secondary | ICD-10-CM | POA: Diagnosis not present

## 2023-03-08 ENCOUNTER — Other Ambulatory Visit: Payer: Self-pay | Admitting: Family Medicine

## 2023-03-08 NOTE — Telephone Encounter (Signed)
Last OV: 01/22/2022

## 2023-03-08 NOTE — Telephone Encounter (Signed)
Needs appt

## 2023-03-09 NOTE — Telephone Encounter (Signed)
Patient scheduled.

## 2023-03-16 ENCOUNTER — Ambulatory Visit: Payer: Medicare PPO | Admitting: Family Medicine

## 2023-03-22 ENCOUNTER — Encounter: Payer: Self-pay | Admitting: Family Medicine

## 2023-03-22 ENCOUNTER — Ambulatory Visit: Payer: Medicare PPO | Admitting: Family Medicine

## 2023-03-22 VITALS — BP 118/60 | HR 70 | Temp 98.4°F | Resp 16 | Ht 59.0 in | Wt 146.4 lb

## 2023-03-22 DIAGNOSIS — J4530 Mild persistent asthma, uncomplicated: Secondary | ICD-10-CM | POA: Diagnosis not present

## 2023-03-22 DIAGNOSIS — I878 Other specified disorders of veins: Secondary | ICD-10-CM | POA: Diagnosis not present

## 2023-03-22 DIAGNOSIS — F331 Major depressive disorder, recurrent, moderate: Secondary | ICD-10-CM | POA: Diagnosis not present

## 2023-03-22 DIAGNOSIS — R600 Localized edema: Secondary | ICD-10-CM | POA: Diagnosis not present

## 2023-03-22 LAB — CBC WITH DIFFERENTIAL/PLATELET
Basophils Absolute: 0 10*3/uL (ref 0.0–0.1)
Basophils Relative: 0.6 % (ref 0.0–3.0)
Eosinophils Absolute: 0 10*3/uL (ref 0.0–0.7)
Eosinophils Relative: 0.7 % (ref 0.0–5.0)
HCT: 41.2 % (ref 36.0–46.0)
Hemoglobin: 13.5 g/dL (ref 12.0–15.0)
Lymphocytes Relative: 44.4 % (ref 12.0–46.0)
Lymphs Abs: 2.5 10*3/uL (ref 0.7–4.0)
MCHC: 32.8 g/dL (ref 30.0–36.0)
MCV: 95.9 fL (ref 78.0–100.0)
Monocytes Absolute: 0.4 10*3/uL (ref 0.1–1.0)
Monocytes Relative: 7.6 % (ref 3.0–12.0)
Neutro Abs: 2.6 10*3/uL (ref 1.4–7.7)
Neutrophils Relative %: 46.7 % (ref 43.0–77.0)
Platelets: 291 10*3/uL (ref 150.0–400.0)
RBC: 4.29 Mil/uL (ref 3.87–5.11)
RDW: 13 % (ref 11.5–15.5)
WBC: 5.5 10*3/uL (ref 4.0–10.5)

## 2023-03-22 LAB — COMPREHENSIVE METABOLIC PANEL
ALT: 11 U/L (ref 0–35)
AST: 20 U/L (ref 0–37)
Albumin: 4.3 g/dL (ref 3.5–5.2)
Alkaline Phosphatase: 92 U/L (ref 39–117)
BUN: 11 mg/dL (ref 6–23)
CO2: 30 meq/L (ref 19–32)
Calcium: 9.5 mg/dL (ref 8.4–10.5)
Chloride: 100 meq/L (ref 96–112)
Creatinine, Ser: 0.92 mg/dL (ref 0.40–1.20)
GFR: 60.5 mL/min (ref >60.00)
Glucose, Bld: 90 mg/dL (ref 70–99)
Potassium: 3.8 meq/L (ref 3.5–5.1)
Sodium: 139 meq/L (ref 135–145)
Total Bilirubin: 0.7 mg/dL (ref 0.2–1.2)
Total Protein: 6.8 g/dL (ref 6.0–8.3)

## 2023-03-22 MED ORDER — ESCITALOPRAM OXALATE 20 MG PO TABS: 20.0000 mg | ORAL_TABLET | Freq: Every day | ORAL | 1 refills | Status: DC

## 2023-03-22 MED ORDER — TRIAMTERENE-HCTZ 37.5-25 MG PO CAPS: 1.0000 | ORAL_CAPSULE | Freq: Every morning | ORAL | 1 refills | Status: DC

## 2023-03-22 NOTE — Assessment & Plan Note (Signed)
Chronic.  Fair control on dyazide 37.5/25.  wear compression stockings

## 2023-03-22 NOTE — Assessment & Plan Note (Signed)
Chronic.  Not controlled but pt not using inhalers-advised to use them.

## 2023-03-22 NOTE — Assessment & Plan Note (Signed)
Chronic.  Fair control.  Advised to use compression stocks as diuretics won't solve all of it

## 2023-03-22 NOTE — Patient Instructions (Signed)

## 2023-03-22 NOTE — Progress Notes (Signed)
Subjective:     Patient ID: Ariel Cook, female    DOB: 1946/03/26, 77 y.o.   MRN: [id number]  Chief Complaint  Patient presents with   Medication Follow-up    Follow-up of blood pressure and anxiety medications    HPI  Depression / Anxiety - She is taking Lexapro 10 mg. Her depression has worsened since her last visit on 01/22/22. She worries for her son who is quadriplegic and is currently living in a group home. She notes in the last month he has had about 3 different incidences that has contributed to her anxiety. She is able to bring him home from the group-home every other weekend or so. No SI.    Edema - Taking Dyazide daily, which has helped some, She reports some bilateral edema still. Has not been wearing compression socks. Also reports a red spot on her right lower leg that intermitt gets redder and pain then resolves. Next derm visit is 04/2024.   Asthma - She endorses waking up every morning with "fluid", difficultly breathing, and constant productive cough throughout the day. Has not been using her inhalers-just overwhelmed w/life and just "too much". Reports hearing herself wheeze on some mornings.   Back pain - Chronic back pain continues. She last received an injection to manage the pain in September. States the injections seemed to help or she "would be lying on the floor in pain if they didn't work".  Health Maintenance Due  Topic Date Due   Medicare Annual Wellness (AWV)  05/12/2023    Past Medical History:  Diagnosis Date   Allergy Always   Anemia Youth   Anxiety    Asthma    USES ALBUTEROL INHALER   Cataract 2011   Surgery   DDD (degenerative disc disease), cervical    Depression Chronic   GERD (gastroesophageal reflux disease) Ongoing   Headache(784.0)    Left carpal tunnel syndrome 09/03/2011   Osteoporosis    PONV (postoperative nausea and vomiting)    Sciatica    Unspecified adverse effect of anesthesia    CAUSES ASTHMA TO EXACERBATE, CAUSES  MIGRAINES    Past Surgical History:  Procedure Laterality Date   APPENDECTOMY  2010   CARPAL TUNNEL RELEASE  2010   RT   CARPAL TUNNEL RELEASE  09/03/2011   Procedure: CARPAL TUNNEL RELEASE;  Surgeon: Eulas Post, MD;  Location: [facility];  Service: Orthopedics;  Laterality: Left;   COLONOSCOPY N/A 04/03/2020   Procedure: COLONOSCOPY;  Surgeon: Malissa Hippo, MD;  [facility];  Service: Endoscopy;  Laterality: N/A;  730, per office moved to 12/2 @ 8:30   EYE SURGERY  2011   BIL CATARACT   POLYPECTOMY  04/03/2020   Procedure: POLYPECTOMY INTESTINAL;  Surgeon: Malissa Hippo, MD;  [facility];  Service: Endoscopy;;   TUBAL LIGATION  1976     Current Outpatient Medications:    acetaminophen (TYLENOL) 650 MG CR tablet, Take 650 mg by mouth every 8 (eight) hours as needed for pain., Disp: , Rfl:    albuterol (VENTOLIN HFA) 108 (90 Base) MCG/ACT inhaler, Inhale 2 puffs into the lungs every 6 (six) hours as needed for wheezing or shortness of breath., Disp: 18 g, Rfl: 1   Biotin 40981 MCG TABS, Take 10,000 mcg by mouth daily. , Disp: , Rfl:    cyclobenzaprine (FLEXERIL) 10 MG tablet, Take 10 mg by mouth 2 (two) times daily as needed., Disp: , Rfl:  fluticasone (FLOVENT HFA) 110 MCG/ACT inhaler, Inhale 2 puffs into the lungs 2 (two) times daily., Disp: 3 each, Rfl: 3   ibuprofen (ADVIL) 200 MG tablet, Take 600 mg by mouth every 6 (six) hours as needed., Disp: , Rfl:    lidocaine (LIDODERM) 5 %, Place 1 patch onto the skin daily. Remove & Discard patch within 12 hours or as directed by MD, Disp: 30 patch, Rfl: 0   Misc Natural Products (OSTEO BI-FLEX JOINT SHIELD PO), Take 2 tablets by mouth daily. Triple Strength with Turmeric, Disp: , Rfl:    Multiple Vitamins-Minerals (PRESERVISION AREDS 2) CAPS, SMARTSIG:1 By Mouth, Disp: , Rfl:    Polyvinyl Alcohol-Povidone PF (REFRESH) 1.4-0.6 % SOLN, Place 1 drop into both eyes daily as needed (Dry eye).,  Disp: , Rfl:    escitalopram (LEXAPRO) 20 MG tablet, Take 1 tablet (20 mg total) by mouth daily., Disp: 90 tablet, Rfl: 1   triamterene-hydrochlorothiazide (DYAZIDE) 37.5-25 MG capsule, Take 1 each (1 capsule total) by mouth every morning., Disp: 90 capsule, Rfl: 1  Allergies  Allergen Reactions   Sulfa Antibiotics Anaphylaxis   Other     Pain meds (cannot tolerate NARCOTICS)    Stadol [Butorphanol]     Vomiting and excessive drowsiness   Codeine Palpitations   ROS neg/noncontributory except as noted HPI/below      Objective:     BP 118/60   Pulse 70   Temp 98.4 F (36.9 C) (Temporal)   Resp 16   Ht 4\' 11"  (1.499 m)   Wt 146 lb 6 oz (66.4 kg)   SpO2 99%   BMI 29.56 kg/m  Wt Readings from Last 3 Encounters:  03/22/23 146 lb 6 oz (66.4 kg)  05/11/22 140 lb (63.5 kg)  01/22/22 146 lb (66.2 kg)    Physical Exam   Gen: WDWN NAD HEENT: NCAT, conjunctiva not injected, sclera nonicteric NECK:  supple, no thyromegaly, no nodes, no carotid bruits CARDIAC: RRR, S1S2+, no murmur. DP 2+B LUNGS: CTAB. No wheezes ABDOMEN:  BS+, soft, NTND, No HSM, no masses EXT:  +Trace edema and spider veins bilaterally MSK: no gross abnormalities.  NEURO: A&O x3.  CN II-XII intact.  PSYCH: normal mood. Good eye contact     Assessment & Plan:  Mild persistent asthma without complication Assessment & Plan: Chronic.  Not controlled but pt not using inhalers-advised to use them.    Localized edema Assessment & Plan: Chronic.  Fair control on dyazide 37.5/25.  wear compression stockings  Orders: -     Triamterene-HCTZ; Take 1 each (1 capsule total) by mouth every morning.  Dispense: 90 capsule; Refill: 1 -     CBC with Differential/Platelet -     Comprehensive metabolic panel  Venous stasis Assessment & Plan: Chronic.  Fair control.  Advised to use compression stocks as diuretics won't solve all of it  Orders: -     Triamterene-HCTZ; Take 1 each (1 capsule total) by mouth every  morning.  Dispense: 90 capsule; Refill: 1  Moderate episode of recurrent major depressive disorder (HCC) Assessment & Plan: Chronic.  Not controlled.  Pt not wanting to add more meds, but willing to increase lexapro to 20mg  daily(or 1/2 bid).    Orders: -     Escitalopram Oxalate; Take 1 tablet (20 mg total) by mouth daily.  Dispense: 90 tablet; Refill: 1    Return in about 6 months (around 09/19/2023) for mood,edema.   soon w/Tina for AWV.   I, Isabelle Course, acting  as a scribe for Angelena Sole, MD., have documented all relevant documentation on the behalf of Angelena Sole, MD, as directed by  Angelena Sole, MD while in the presence of Angelena Sole, MD.  I, Angelena Sole, MD, have reviewed all documentation for this visit. The documentation on 03/22/23 for the exam, diagnosis, procedures, and orders are all accurate and complete.  Angelena Sole, MD

## 2023-03-22 NOTE — Assessment & Plan Note (Signed)
Chronic.  Not controlled.  Pt not wanting to add more meds, but willing to increase lexapro to 20mg  daily(or 1/2 bid).

## 2023-03-22 NOTE — Progress Notes (Signed)
normal

## 2023-04-04 ENCOUNTER — Other Ambulatory Visit: Payer: Self-pay | Admitting: Family Medicine

## 2023-04-04 DIAGNOSIS — J4530 Mild persistent asthma, uncomplicated: Secondary | ICD-10-CM

## 2023-04-04 MED ORDER — ARNUITY ELLIPTA 100 MCG/ACT IN AEPB: 1.0000 | INHALATION_SPRAY | Freq: Every day | RESPIRATORY_TRACT | 3 refills | Status: DC

## 2023-07-11 DIAGNOSIS — L720 Epidermal cyst: Secondary | ICD-10-CM | POA: Diagnosis not present

## 2023-07-11 DIAGNOSIS — D2261 Melanocytic nevi of right upper limb, including shoulder: Secondary | ICD-10-CM | POA: Diagnosis not present

## 2023-07-11 DIAGNOSIS — Z85828 Personal history of other malignant neoplasm of skin: Secondary | ICD-10-CM | POA: Diagnosis not present

## 2023-07-11 DIAGNOSIS — L570 Actinic keratosis: Secondary | ICD-10-CM | POA: Diagnosis not present

## 2023-07-11 DIAGNOSIS — L821 Other seborrheic keratosis: Secondary | ICD-10-CM | POA: Diagnosis not present

## 2023-07-11 DIAGNOSIS — L820 Inflamed seborrheic keratosis: Secondary | ICD-10-CM | POA: Diagnosis not present

## 2023-09-20 ENCOUNTER — Encounter: Payer: Self-pay | Admitting: Family Medicine

## 2023-09-20 ENCOUNTER — Ambulatory Visit: Payer: Medicare PPO | Admitting: Family Medicine

## 2023-09-20 VITALS — BP 113/62 | HR 66 | Temp 97.5°F | Resp 18 | Ht 59.0 in | Wt 150.2 lb

## 2023-09-20 DIAGNOSIS — I878 Other specified disorders of veins: Secondary | ICD-10-CM | POA: Diagnosis not present

## 2023-09-20 DIAGNOSIS — R7303 Prediabetes: Secondary | ICD-10-CM | POA: Diagnosis not present

## 2023-09-20 DIAGNOSIS — F331 Major depressive disorder, recurrent, moderate: Secondary | ICD-10-CM | POA: Diagnosis not present

## 2023-09-20 DIAGNOSIS — R600 Localized edema: Secondary | ICD-10-CM

## 2023-09-20 DIAGNOSIS — J4530 Mild persistent asthma, uncomplicated: Secondary | ICD-10-CM

## 2023-09-20 MED ORDER — ALBUTEROL SULFATE HFA 108 (90 BASE) MCG/ACT IN AERS: 2.0000 | INHALATION_SPRAY | Freq: Four times a day (QID) | RESPIRATORY_TRACT | 1 refills | Status: AC | PRN

## 2023-09-20 MED ORDER — TRIAMTERENE-HCTZ 37.5-25 MG PO CAPS: 1.0000 | ORAL_CAPSULE | Freq: Every morning | ORAL | 1 refills | Status: DC

## 2023-09-20 MED ORDER — ARNUITY ELLIPTA 100 MCG/ACT IN AEPB: 1.0000 | INHALATION_SPRAY | Freq: Every day | RESPIRATORY_TRACT | 6 refills | Status: AC

## 2023-09-20 MED ORDER — ESCITALOPRAM OXALATE 20 MG PO TABS: 20.0000 mg | ORAL_TABLET | Freq: Every day | ORAL | 1 refills | Status: DC

## 2023-09-20 NOTE — Patient Instructions (Signed)
 It was very nice to see you today!  Let me know if flovent  doesn't get done   PLEASE NOTE:  If you had any lab tests please let us  know if you have not heard back within a few days. You may see your results on MyChart before we have a chance to review them but we will give you a call once they are reviewed by us . If we ordered any referrals today, please let us  know if you have not heard from their office within the next week.   Please try these tips to maintain a healthy lifestyle:  Eat most of your calories during the day when you are active. Eliminate processed foods including packaged sweets (pies, cakes, cookies), reduce intake of potatoes, white bread, white pasta, and white rice. Look for whole grain options, oat flour or almond flour.  Each meal should contain half fruits/vegetables, one quarter protein, and one quarter carbs (no bigger than a computer mouse).  Cut down on sweet beverages. This includes juice, soda, and sweet tea. Also watch fruit intake, though this is a healthier sweet option, it still contains natural sugar! Limit to 3 servings daily.  Drink at least 1 glass of water  with each meal and aim for at least 8 glasses per day  Exercise at least 150 minutes every week.

## 2023-09-20 NOTE — Progress Notes (Signed)
 Subjective:     Patient ID: Ariel Cook, female    DOB: [birth year], 78 y.o.   MRN: [id number]  Chief Complaint  Patient presents with   Medical Management of Chronic Issues    6 month follow-up on mood, edema Discuss refill of Flovent  inhaler    HPI Asthma, edema, back, dep/anx Discussed the use of AI scribe software for clinical note transcription with the patient, who gave verbal consent to proceed.  History of Present Illness Ariel Cook is a 78 year old female with asthma and depression who presents with increased coughing and respiratory symptoms.  She has experienced increased coughing and respiratory symptoms over the past few months, with constant production of white foamy sputum and rhinorrhea. Occasionally, the sputum is thick yellow or green. She experiences intermittent dyspnea. She has not used her Flovent  inhaler for the past five months due to a medication substitution issue and has been relying on her albuterol  inhaler as needed for acute shortness of breath.  She has a history of asthma and uses an albuterol  inhaler as a rescue medication. She was previously on Flovent  but did not take the substitute medication, Arnuity, due to unfamiliarity. She experiences more frequent coughing without the Flovent . Her mother had COPD and 'brown lung', and she has a family history of respiratory issues. 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  She takes Dyazide for swelling, though not consistently. The swelling is manageable when she takes the medication and elevates her legs. No side effects from Dyazide, such as chest pain or shortness of breath unrelated to asthma.  She has a history of depression and is currently taking 20 mg of Lexapro  daily. She reports variable mood, with some days being better than others. She engages in activities like reading and gardening to manage her mental health. No suicidal thoughts, citing her responsibilities to her family as a  protective factor.  She experiences significant sleep disturbances, often unable to fall asleep until late at night and waking up after only a few hours. This occurs two to three nights a week, with some nights allowing for more rest. She attributes her sleep issues to an overactive mind and stress, and she does not wish to take additional medications for sleep.  She has a family history of diabetes, with her sister being 'very diabetic'. She herself has been 'on the border' of diabetes but has not been diagnosed.     Health Maintenance Due  Topic Date Due   Medicare Annual Wellness (AWV)  05/12/2023    Past Medical History:  Diagnosis Date   Allergy Always   Anemia Youth   Anxiety    Asthma    USES ALBUTEROL  INHALER   Cataract 2011   Surgery   DDD (degenerative disc disease), cervical    Depression Chronic   GERD (gastroesophageal reflux disease) Ongoing   Headache(784.0)    Left carpal tunnel syndrome 09/03/2011   Osteoporosis    PONV (postoperative nausea and vomiting)    Sciatica    Unspecified adverse effect of anesthesia    CAUSES ASTHMA TO EXACERBATE, CAUSES MIGRAINES    Past Surgical History:  Procedure Laterality Date   APPENDECTOMY  2010   CARPAL TUNNEL RELEASE  2010   RT   CARPAL TUNNEL RELEASE  09/03/2011   Procedure: CARPAL TUNNEL RELEASE;  Surgeon: Neville Barbone, MD;  Location: [facility];  Service: Orthopedics;  Laterality: Left;   COLONOSCOPY N/A 04/03/2020  Procedure: COLONOSCOPY;  Surgeon: Ruby Corporal, MD;  [facility]: [facility];  Service: Endoscopy;  Laterality: N/A;  730, per office moved to 12/2 @ 8:30   EYE SURGERY  2011   BIL CATARACT   POLYPECTOMY  04/03/2020   Procedure: POLYPECTOMY INTESTINAL;  Surgeon: Ruby Corporal, MD;  Location: [facility];  Service: Endoscopy;;   TUBAL LIGATION  1976     Current Outpatient Medications:    acetaminophen  (TYLENOL ) 650 MG CR tablet, Take 650 mg by mouth every 8 (eight) hours as  needed for pain., Disp: , Rfl:    Biotin 40981 MCG TABS, Take 10,000 mcg by mouth daily. , Disp: , Rfl:    cyclobenzaprine  (FLEXERIL ) 10 MG tablet, Take 10 mg by mouth 2 (two) times daily as needed., Disp: , Rfl:    Fluticasone  Furoate (ARNUITY ELLIPTA ) 100 MCG/ACT AEPB, Inhale 1 Inhalation into the lungs daily at 12 noon., Disp: 30 each, Rfl: 6   ibuprofen (ADVIL) 200 MG tablet, Take 600 mg by mouth every 6 (six) hours as needed., Disp: , Rfl:    lidocaine  (LIDODERM ) 5 %, Place 1 patch onto the skin daily. Remove & Discard patch within 12 hours or as directed by MD, Disp: 30 patch, Rfl: 0   Misc Natural Products (OSTEO BI-FLEX JOINT SHIELD PO), Take 2 tablets by mouth daily. Triple Strength with Turmeric, Disp: , Rfl:    Multiple Vitamins-Minerals (PRESERVISION AREDS 2) CAPS, SMARTSIG:1 By Mouth, Disp: , Rfl:    Polyvinyl Alcohol-Povidone PF (REFRESH) 1.4-0.6 % SOLN, Place 1 drop into both eyes daily as needed (Dry eye)., Disp: , Rfl:    albuterol  (VENTOLIN  HFA) 108 (90 Base) MCG/ACT inhaler, Inhale 2 puffs into the lungs every 6 (six) hours as needed for wheezing or shortness of breath., Disp: 18 g, Rfl: 1   escitalopram  (LEXAPRO ) 20 MG tablet, Take 1 tablet (20 mg total) by mouth daily., Disp: 90 tablet, Rfl: 1   Fluticasone  Furoate (ARNUITY ELLIPTA ) 100 MCG/ACT AEPB, Inhale 1 puff into the lungs daily at 12 noon. (Patient not taking: Reported on 09/20/2023), Disp: 30 each, Rfl: 3   triamterene -hydrochlorothiazide (DYAZIDE) 37.5-25 MG capsule, Take 1 each (1 capsule total) by mouth every morning., Disp: 90 capsule, Rfl: 1  Allergies  Allergen Reactions   Sulfa Antibiotics Anaphylaxis   Other     Pain meds (cannot tolerate NARCOTICS)    Stadol [Butorphanol]     Vomiting and excessive drowsiness   Codeine Palpitations   ROS neg/noncontributory except as noted HPI/below      Objective:      BP 113/62   Pulse 66   Temp (!) 97.5 F (36.4 C) (Temporal)   Resp 18   Ht 4\' 11"  (1.499 m)    Wt 150 lb 4 oz (68.2 kg)   SpO2 97%   BMI 30.35 kg/m  Wt Readings from Last 3 Encounters:  09/20/23 150 lb 4 oz (68.2 kg)  03/22/23 146 lb 6 oz (66.4 kg)  05/11/22 140 lb (63.5 kg)    Physical Exam   Gen: WDWN NAD HEENT: NCAT, conjunctiva not injected, sclera nonicteric NECK:  supple, no thyromegaly, no nodes, no carotid bruits CARDIAC: RRR, S1S2+, no murmur. DP 2+B LUNGS: CTAB. No wheezes.  But is coughing  ABDOMEN:  BS+, soft, NTND, No HSM, no masses EXT:  no edema MSK: no gross abnormalities.  NEURO: A&O x3.  CN II-XII intact.  PSYCH: normal mood. Good eye contact     Assessment & Plan:  Localized edema -     CBC with Differential/Platelet -     Comprehensive metabolic panel with GFR -     Lipid panel -     TSH -     Triamterene -HCTZ; Take 1 each (1 capsule total) by mouth every morning.  Dispense: 90 capsule; Refill: 1  Mild persistent asthma without complication -     Albuterol  Sulfate HFA; Inhale 2 puffs into the lungs every 6 (six) hours as needed for wheezing or shortness of breath.  Dispense: 18 g; Refill: 1 -     Arnuity Ellipta ; Inhale 1 Inhalation into the lungs daily at 12 noon.  Dispense: 30 each; Refill: 6  Moderate episode of recurrent major depressive disorder (HCC) -     Escitalopram  Oxalate; Take 1 tablet (20 mg total) by mouth daily.  Dispense: 90 tablet; Refill: 1  Prediabetes -     Comprehensive metabolic panel with GFR -     Lipid panel -     TSH -     Hemoglobin A1c  Venous stasis -     Triamterene -HCTZ; Take 1 each (1 capsule total) by mouth every morning.  Dispense: 90 capsule; Refill: 1  Assessment and Plan Assessment & Plan Asthma   Chronic asthma has worsened with increased coughing and production of white foamy sputum over the past few months. She has not used Flovent  for five months due to confusion with medication substitution, leading to increased shortness of breath. A switch to Arnuity is discussed due to insurance coverage  issues with Flovent . Arnuity, a once-daily inhaled corticosteroid similar to Flovent , is expected to improve compliance. If Arnuity proves ineffective, a prior authorization for Flovent  will be pursued. Prescribe Arnuity for asthma management and educate on its proper use. Refill the albuterol  inhaler for rescue use. Monitor her response to Arnuity and report any adverse effects.  Peripheral edema   Intermittent leg swelling is managed with triamterene  HCT, which improves swelling with medication and leg elevation. No significant side effects are reported from the diuretic. Continue triamterene  HCT as prescribed and encourage leg elevation to manage swelling.  Depression   Chronic depression is managed with Lexapro  20 mg daily. She reports variable mood with some days better than others, but no suicidal ideation. Stress from family issues is noted. Continue Lexapro  20 mg daily and encourage stress management techniques such as gardening and reading.  Insomnia   Chronic insomnia presents with difficulty falling and staying asleep. She prefers not to take additional medications for sleep, with mind racing at night contributing to sleep difficulties. Continue current non-pharmacological strategies for sleep management and reassess if sleep issues worsen or she desires medication.    Return in about 6 months (around 03/22/2024) for chronic follow-up and cpe.  sson awv w/Tina.  Ellsworth Haas, MD

## 2023-09-21 ENCOUNTER — Ambulatory Visit: Payer: Self-pay | Admitting: Family Medicine

## 2023-09-21 LAB — CBC WITH DIFFERENTIAL/PLATELET
Basophils Absolute: 0.1 10*3/uL (ref 0.0–0.1)
Basophils Relative: 1.1 % (ref 0.0–3.0)
Eosinophils Absolute: 0 10*3/uL (ref 0.0–0.7)
Eosinophils Relative: 0.8 % (ref 0.0–5.0)
HCT: 39.4 % (ref 36.0–46.0)
Hemoglobin: 13.4 g/dL (ref 12.0–15.0)
Lymphocytes Relative: 45.4 % (ref 12.0–46.0)
Lymphs Abs: 2.3 10*3/uL (ref 0.7–4.0)
MCHC: 34 g/dL (ref 30.0–36.0)
MCV: 92.1 fl (ref 78.0–100.0)
Monocytes Absolute: 0.5 10*3/uL (ref 0.1–1.0)
Monocytes Relative: 9.4 % (ref 3.0–12.0)
Neutro Abs: 2.2 10*3/uL (ref 1.4–7.7)
Neutrophils Relative %: 43.3 % (ref 43.0–77.0)
Platelets: 272 10*3/uL (ref 150.0–400.0)
RBC: 4.28 Mil/uL (ref 3.87–5.11)
RDW: 12.9 % (ref 11.5–15.5)
WBC: 5 10*3/uL (ref 4.0–10.5)

## 2023-09-21 LAB — COMPREHENSIVE METABOLIC PANEL WITH GFR
ALT: 11 U/L (ref 0–35)
AST: 19 U/L (ref 0–37)
Albumin: 4.2 g/dL (ref 3.5–5.2)
Alkaline Phosphatase: 87 U/L (ref 39–117)
BUN: 14 mg/dL (ref 6–23)
CO2: 31 meq/L (ref 19–32)
Calcium: 9.4 mg/dL (ref 8.4–10.5)
Chloride: 98 meq/L (ref 96–112)
Creatinine, Ser: 0.88 mg/dL (ref 0.40–1.20)
GFR: 63.59 mL/min (ref >60.00)
Glucose, Bld: 82 mg/dL (ref 70–99)
Potassium: 3.8 meq/L (ref 3.5–5.1)
Sodium: 137 meq/L (ref 135–145)
Total Bilirubin: 0.6 mg/dL (ref 0.2–1.2)
Total Protein: 6.7 g/dL (ref 6.0–8.3)

## 2023-09-21 LAB — LIPID PANEL
Cholesterol: 211 mg/dL — ABNORMAL HIGH (ref 0–200)
HDL: 75.5 mg/dL (ref >39.00)
LDL Cholesterol: 121 mg/dL — ABNORMAL HIGH (ref 0–99)
NonHDL: 135.75
Total CHOL/HDL Ratio: 3
Triglycerides: 72 mg/dL (ref 0.0–149.0)
VLDL: 14.4 mg/dL (ref 0.0–40.0)

## 2023-09-21 LAB — HEMOGLOBIN A1C: Hgb A1c MFr Bld: 5.8 % (ref 4.6–6.5)

## 2023-09-21 LAB — TSH: TSH: 1.42 u[IU]/mL (ref 0.35–5.50)

## 2023-09-21 NOTE — Progress Notes (Signed)
 Labs stable A1C(3 month average of sugars) is elevated.  This is considered PreDiabetes.  Work on diet-decrease sugars and starches and aim for 30 minutes of exercise 5 days/week to prevent progression to diabetes

## 2023-09-22 ENCOUNTER — Telehealth: Payer: Self-pay | Admitting: *Deleted

## 2023-09-22 NOTE — Telephone Encounter (Signed)
 Copied from CRM [id number]. Topic: Clinical - Lab/Test Results >> Sep 22, 2023 10:56 AM Bambi Bonine D wrote: Reason for CRM: Pt was made aware of the lab results and what Dr.Kulik recommends moving forward.  Noted.

## 2023-12-01 DIAGNOSIS — M5416 Radiculopathy, lumbar region: Secondary | ICD-10-CM | POA: Diagnosis not present

## 2023-12-27 DIAGNOSIS — M5416 Radiculopathy, lumbar region: Secondary | ICD-10-CM | POA: Diagnosis not present

## 2024-02-29 ENCOUNTER — Encounter (INDEPENDENT_AMBULATORY_CARE_PROVIDER_SITE_OTHER): Payer: Medicare PPO | Admitting: Ophthalmology

## 2024-02-29 DIAGNOSIS — H43813 Vitreous degeneration, bilateral: Secondary | ICD-10-CM | POA: Diagnosis not present

## 2024-02-29 DIAGNOSIS — H353132 Nonexudative age-related macular degeneration, bilateral, intermediate dry stage: Secondary | ICD-10-CM

## 2024-02-29 DIAGNOSIS — D3132 Benign neoplasm of left choroid: Secondary | ICD-10-CM | POA: Diagnosis not present

## 2024-02-29 DIAGNOSIS — D3131 Benign neoplasm of right choroid: Secondary | ICD-10-CM | POA: Diagnosis not present

## 2024-03-20 DIAGNOSIS — M51362 Other intervertebral disc degeneration, lumbar region with discogenic back pain and lower extremity pain: Secondary | ICD-10-CM | POA: Diagnosis not present

## 2024-03-20 DIAGNOSIS — M47816 Spondylosis without myelopathy or radiculopathy, lumbar region: Secondary | ICD-10-CM | POA: Diagnosis not present

## 2024-03-23 ENCOUNTER — Encounter: Payer: Self-pay | Admitting: Family Medicine

## 2024-03-23 ENCOUNTER — Ambulatory Visit: Payer: Self-pay | Admitting: Family Medicine

## 2024-03-23 ENCOUNTER — Ambulatory Visit (INDEPENDENT_AMBULATORY_CARE_PROVIDER_SITE_OTHER): Admitting: Family Medicine

## 2024-03-23 VITALS — BP 114/62 | HR 68 | Temp 97.0°F | Ht 60.0 in | Wt 152.4 lb

## 2024-03-23 DIAGNOSIS — J4530 Mild persistent asthma, uncomplicated: Secondary | ICD-10-CM

## 2024-03-23 DIAGNOSIS — F331 Major depressive disorder, recurrent, moderate: Secondary | ICD-10-CM | POA: Diagnosis not present

## 2024-03-23 DIAGNOSIS — M4807 Spinal stenosis, lumbosacral region: Secondary | ICD-10-CM | POA: Diagnosis not present

## 2024-03-23 DIAGNOSIS — R600 Localized edema: Secondary | ICD-10-CM

## 2024-03-23 DIAGNOSIS — Z0000 Encounter for general adult medical examination without abnormal findings: Secondary | ICD-10-CM | POA: Diagnosis not present

## 2024-03-23 DIAGNOSIS — I878 Other specified disorders of veins: Secondary | ICD-10-CM | POA: Diagnosis not present

## 2024-03-23 DIAGNOSIS — R7303 Prediabetes: Secondary | ICD-10-CM | POA: Diagnosis not present

## 2024-03-23 LAB — CBC WITH DIFFERENTIAL/PLATELET
Basophils Absolute: 0 K/uL (ref 0.0–0.1)
Basophils Relative: 0.3 % (ref 0.0–3.0)
Eosinophils Absolute: 0 K/uL (ref 0.0–0.7)
Eosinophils Relative: 0.1 % (ref 0.0–5.0)
HCT: 40.1 % (ref 36.0–46.0)
Hemoglobin: 13.4 g/dL (ref 12.0–15.0)
Lymphocytes Relative: 21.5 % (ref 12.0–46.0)
Lymphs Abs: 1.4 K/uL (ref 0.7–4.0)
MCHC: 33.5 g/dL (ref 30.0–36.0)
MCV: 93.9 fl (ref 78.0–100.0)
Monocytes Absolute: 0.3 K/uL (ref 0.1–1.0)
Monocytes Relative: 5 % (ref 3.0–12.0)
Neutro Abs: 4.6 K/uL (ref 1.4–7.7)
Neutrophils Relative %: 73.1 % (ref 43.0–77.0)
Platelets: 279 K/uL (ref 150.0–400.0)
RBC: 4.28 Mil/uL (ref 3.87–5.11)
RDW: 13.2 % (ref 11.5–15.5)
WBC: 6.3 K/uL (ref 4.0–10.5)

## 2024-03-23 LAB — COMPREHENSIVE METABOLIC PANEL WITH GFR
ALT: 13 U/L (ref 0–35)
AST: 20 U/L (ref 0–37)
Albumin: 4.4 g/dL (ref 3.5–5.2)
Alkaline Phosphatase: 91 U/L (ref 39–117)
BUN: 9 mg/dL (ref 6–23)
CO2: 32 meq/L (ref 19–32)
Calcium: 9.5 mg/dL (ref 8.4–10.5)
Chloride: 99 meq/L (ref 96–112)
Creatinine, Ser: 0.93 mg/dL (ref 0.40–1.20)
GFR: 59.3 mL/min — ABNORMAL LOW (ref >60.00)
Glucose, Bld: 94 mg/dL (ref 70–99)
Potassium: 3.8 meq/L (ref 3.5–5.1)
Sodium: 139 meq/L (ref 135–145)
Total Bilirubin: 0.6 mg/dL (ref 0.2–1.2)
Total Protein: 7.2 g/dL (ref 6.0–8.3)

## 2024-03-23 LAB — HEMOGLOBIN A1C: Hgb A1c MFr Bld: 5.6 % (ref 4.6–6.5)

## 2024-03-23 MED ORDER — ESCITALOPRAM OXALATE 20 MG PO TABS: 20.0000 mg | ORAL_TABLET | Freq: Every day | ORAL | 1 refills | Status: AC

## 2024-03-23 MED ORDER — TRIAMTERENE-HCTZ 37.5-25 MG PO CAPS: 1.0000 | ORAL_CAPSULE | Freq: Every morning | ORAL | 1 refills | Status: AC

## 2024-03-23 NOTE — Patient Instructions (Signed)

## 2024-03-23 NOTE — Progress Notes (Signed)
 Phone [phone]   Subjective:   Patient is a 78 y.o. female presenting for annual physical.    Chief Complaint  Patient presents with   Annual Exam    Pt is here for CPE    Discussed the use of AI scribe software for clinical note transcription with the patient, who gave verbal consent to proceed.  History of Present Illness Ariel Cook is a 78 year old female who presents for a wellness check and follow-up on her chronic conditions.  She continues to manage her edema with daily diazide, though she notes increased bruising. For depression, she takes Lexapro  20 mg but sometimes forgets due to the number of medications she is on.  She takes medication for her macular degeneration, which sometimes causes nausea and vomiting. She recently had a check-up and was told by her eye doctor that she was doing well, with no follow-up needed for a year. She recently had a check-up with no follow-up needed for a year.  She has asthma and was previously on Arnuity but stopped while on prednisone  due to concerns about interactions. She experiences wheezing, especially with weather changes and falling leaves, and has not used albuterol  recently due to side effects of shakiness and jitteriness. Crocheting is calming for her, and she makes hats for charity.  She was prescribed prednisone  by her orthopedic doctor for back spasms and pain related to degenerative disc disease, stenosis, scoliosis, and sciatica. Prednisone  helps with both her back and lung issues. She has stopped using her breathing treatment while on prednisone .  She experiences insomnia, often not sleeping until 4 or 5 AM, attributing this to an active mind. She sometimes reads or drinks milk to help her sleep. No suicidal thoughts, as she needs to care for her son.  She reports occasional chest pain and pressure, attributing it to anxiety or indigestion, and sometimes takes aspirin for relief. She has a history of reflux and uses Tums  as needed. She has reduced her coffee intake and tries to drink more water .  She has prediabetes and follows a vegetarian diet, enjoying salads but also bread and potatoes. She is working on maintaining a balanced diet.  She experiences dizziness upon standing and has fallen a few times, particularly when trying to avoid falling on her son, who is spastic and cannot move easily but falls are from tripping over him, not the dizziness  Chronic edema/venous insuff-needs dyazide or edema.    See problem oriented charting- ROS- ROS: Gen: no fever, chills  Skin: no rash, itching ENT: no ear pain, ear drainage, nasal congestion, rhinorrhea, sinus pressure, sore throat Eyes: no blurry vision, double vision Resp: no cough, ,SOB CV: no palpitations,    +CP long time-more at HS lying down  GI: no heartburn, n/v/d/c, abd pain.  intermitt GU: no dysuria, urgency, frequency, hematuria MSK: HPI Neuro: no headache, weakness, vertigo.  Occ dizzy when standing up Psych: HPI  The following were reviewed and entered/updated in epic: Past Medical History:  Diagnosis Date   Allergy Always   Anemia Youth   Anxiety    Asthma    USES ALBUTEROL  INHALER   Cataract 2011   Surgery   DDD (degenerative disc disease), cervical    Depression Chronic   GERD (gastroesophageal reflux disease) Ongoing   Headache(784.0)    Left carpal tunnel syndrome 09/03/2011   Macular degeneration    Osteoporosis    PONV (postoperative nausea and vomiting)    Sciatica    Unspecified  adverse effect of anesthesia    CAUSES ASTHMA TO EXACERBATE, CAUSES MIGRAINES   Patient Active Problem List   Diagnosis Date Noted   Spinal stenosis of lumbosacral region 03/23/2024   Prediabetes 09/20/2023   Localized edema 07/22/2021   Mild persistent asthma without complication 07/22/2021   Tubular adenoma of colon 04/06/2020   Depression 08/09/2019   Asthma with exacerbation 01/09/2015   Osteoarthritis cervical spine 03/10/2014    Acute low back pain 02/12/2014   Hereditary and idiopathic peripheral neuropathy 02/12/2014   Venous stasis 03/26/2013   Chest pain 03/26/2013   Left carpal tunnel syndrome 09/03/2011   Past Surgical History:  Procedure Laterality Date   APPENDECTOMY  2010   CARPAL TUNNEL RELEASE  2010   RT   CARPAL TUNNEL RELEASE  09/03/2011   Procedure: CARPAL TUNNEL RELEASE;  Surgeon: Fonda SHAUNNA Olmsted, MD;  Location: [facility];  Service: Orthopedics;  Laterality: Left;   COLONOSCOPY N/A 04/03/2020   Procedure: COLONOSCOPY;  Surgeon: Golda Claudis PENNER, MD;  Location: [facility];  Service: Endoscopy;  Laterality: N/A;  730, per office moved to 12/2 @ 8:30   EYE SURGERY  2011   BIL CATARACT   POLYPECTOMY  04/03/2020   Procedure: POLYPECTOMY INTESTINAL;  Surgeon: Golda Claudis PENNER, MD;  Location: [facility];  Service: Endoscopy;;   TUBAL LIGATION  1976    Family History  Problem Relation Age of Onset   Heart disease Mother    COPD Mother    Glaucoma Mother    Hypertension Mother    Heart attack Father    Arthritis Father    COPD Father    Heart disease Father    Hypertension Father    Stroke Sister    Intellectual disability Sister    Hypertension Sister    Hyperlipidemia Sister    Early death Sister    Drug abuse Sister    COPD Sister    Arthritis Sister    Retinal detachment Sister    Diabetes Sister    Breast cancer Sister    Cancer Sister 72       breast   Depression Sister    Kidney disease Sister    Obesity Sister    Cancer Sister 24       breast   Learning disabilities Son    Birth defects Son    Intellectual disability Son    Mental retardation Son    Diabetes Maternal Aunt    Diabetes Maternal Uncle    Hyperlipidemia Maternal Grandmother    Depression Maternal Grandmother    Arthritis Maternal Grandmother    Varicose Veins Maternal Grandmother    Birth defects Maternal Grandfather    Arthritis Maternal Grandfather    Hyperlipidemia Paternal  Grandmother    Heart disease Paternal Grandmother    Arthritis Paternal Grandmother    Arthritis Paternal Grandfather    Vision loss Paternal Grandfather     Medications- reviewed and updated Current Outpatient Medications  Medication Sig Dispense Refill   acetaminophen  (TYLENOL ) 650 MG CR tablet Take 650 mg by mouth every 8 (eight) hours as needed for pain.     albuterol  (VENTOLIN  HFA) 108 (90 Base) MCG/ACT inhaler Inhale 2 puffs into the lungs every 6 (six) hours as needed for wheezing or shortness of breath. 18 g 1   Biotin 89999 MCG TABS Take 10,000 mcg by mouth daily.      cyclobenzaprine  (FLEXERIL ) 10 MG tablet Take 10 mg by mouth 2 (two) times  daily as needed.     Fluticasone  Furoate (ARNUITY ELLIPTA ) 100 MCG/ACT AEPB Inhale 1 Inhalation into the lungs daily at 12 noon. 30 each 6   ibuprofen (ADVIL) 200 MG tablet Take 600 mg by mouth every 6 (six) hours as needed.     lidocaine  (LIDODERM ) 5 % Place 1 patch onto the skin daily. Remove & Discard patch within 12 hours or as directed by MD 30 patch 0   Multiple Vitamins-Minerals (PRESERVISION AREDS 2) CAPS SMARTSIG:1 By Mouth     Polyvinyl Alcohol-Povidone PF (REFRESH) 1.4-0.6 % SOLN Place 1 drop into both eyes daily as needed (Dry eye).     escitalopram  (LEXAPRO ) 20 MG tablet Take 1 tablet (20 mg total) by mouth daily. 90 tablet 1   triamterene -hydrochlorothiazide (DYAZIDE) 37.5-25 MG capsule Take 1 each (1 capsule total) by mouth every morning. 90 capsule 1   No current facility-administered medications for this visit.    Allergies-reviewed and updated Allergies  Allergen Reactions   Sulfa Antibiotics Anaphylaxis   Other     Pain meds (cannot tolerate NARCOTICS)    Stadol [Butorphanol]     Vomiting and excessive drowsiness   Codeine Palpitations    Social History   Social History Narrative   Not on file   Objective  Objective:  BP 114/62 (BP Location: Left Arm, Patient Position: Sitting, Cuff Size: Normal)   Pulse 68    Temp (!) 97 F (36.1 C) (Temporal)   Ht 5' (1.524 m)   Wt 152 lb 6 oz (69.1 kg)   SpO2 97%   BMI 29.76 kg/m  Physical Exam  Gen: WDWN NAD HEENT: NCAT, conjunctiva not injected, sclera nonicteric TM scarred B, OP moist, no exudates  NECK:  supple, no thyromegaly, no nodes, no carotid bruits CARDIAC: RRR, S1S2+, no murmur. DP 2+B LUNGS: CTAB. No wheezes ABDOMEN:  BS+, soft, NTND, No HSM, no masses EXT:  no edema MSK: no gross abnormalities. MS 5/5 all 4 but some pain NEURO: A&O x3.  CN II-XII intact.  PSYCH: normal mood. Good eye contact     Assessment and Plan   Health Maintenance counseling: 1. Anticipatory guidance: Patient counseled regarding regular dental exams q6 months, eye exams,  avoiding smoking and second hand smoke, limiting alcohol to 1 beverage per day, no illicit drugs.   2. Risk factor reduction:  Advised patient of need for regular exercise and diet rich and fruits and vegetables to reduce risk of heart attack and stroke. Exercise- encouraged.  Wt Readings from Last 3 Encounters:  03/23/24 152 lb 6 oz (69.1 kg)  09/20/23 150 lb 4 oz (68.2 kg)  03/22/23 146 lb 6 oz (66.4 kg)   3. Immunizations/screenings/ancillary studies Immunization History  Administered Date(s) Administered   Fluad Quad(high Dose 65+) 01/22/2022   INFLUENZA, HIGH DOSE SEASONAL PF 05/12/2018, 02/10/2023   Influenza Split 03/22/2013   Influenza,inj,Quad PF,6+ Mos 02/07/2014, 04/19/2016, 02/24/2017   Influenza-Unspecified 02/14/2021, 02/01/2024   Moderna Covid-19 Vaccine Bivalent Booster 5yrs & up 04/06/2021   Moderna Sars-Covid-2 Vaccination 06/16/2019, 07/15/2019, 03/16/2020   Pneumococcal Conjugate-13 03/09/2018   Respiratory Syncytial Virus Vaccine,Recomb Aduvanted(Arexvy) 01/24/2022   Td 03/20/1997   Unspecified SARS-COV-2 Vaccination 02/01/2024   Zoster Recombinant(Shingrix) 06/01/2022, 08/19/2022   Health Maintenance Due  Topic Date Due   Medicare Annual Wellness (AWV)   05/12/2023    4. Cervical cancer screening- n/a 5. Breast cancer screening-  mammogram utd 6. Colon cancer screening - aged out 7. Skin cancer screening- advised regular sunscreen use. Denies  worrisome, changing, or new skin lesions.  8. Birth control/STD check- n/a 9. Osteoporosis screening- gyn 10. Smoking associated screening - non smoker  Wellness examination  Localized edema -     Triamterene -HCTZ; Take 1 each (1 capsule total) by mouth every morning.  Dispense: 90 capsule; Refill: 1 -     CBC with Differential/Platelet -     Comprehensive metabolic panel with GFR  Prediabetes -     Hemoglobin A1c  Mild persistent asthma without complication  Moderate episode of recurrent major depressive disorder (HCC) -     Escitalopram  Oxalate; Take 1 tablet (20 mg total) by mouth daily.  Dispense: 90 tablet; Refill: 1  Venous stasis -     Triamterene -HCTZ; Take 1 each (1 capsule total) by mouth every morning.  Dispense: 90 capsule; Refill: 1  Spinal stenosis of lumbosacral region    Assessment and Plan Assessment & Plan Adult Wellness Visit   During her routine wellness visit, she reported no major headaches, dizziness, or syncope, though she experiences occasional dizziness upon standing and falls due to balance issues. She reports occasional chest pain, palpitations, and a feeling of pressure that resolves on its own, possibly related to anxiety. She has no urinary issues. Anxiety is present, related to family concerns, particularly her sister's health. An annual wellness visit is scheduled with a nurse via phone. Regular exercise and movement are encouraged.  Localized edema   Chronic edema is managed with diazide. She reports bruising, possibly age-related. Continue diazide for edema management.  Mild persistent asthma, uncomplicated   Her asthma is managed with Arnuity. There is a recent increase in wheezing, possibly due to environmental factors. Prednisone  is prescribed for  back spasms, which also aids asthma symptoms. Albuterol  is avoided due to side effects. Continue Arnuity for asthma management and use prednisone  as prescribed for back spasms.  Depression/anxiety, unspecified   Depression is managed with Lexapro  20 mg. She reports occasional forgetfulness in taking medication. Anxiety is present, particularly at night, but there is no suicidal ideation. She prefers not to add additional medications for sleep or anxiety. Continue Lexapro  20 mg for depression and encourage non-pharmacological coping strategies such as crocheting.  Unspecified macular degeneration   Macular degeneration is well-managed. A recent checkup showed good progress, and no current treatment is required.  Spinal stenosis and degenerative disc disease, lumbar region   Chronic back pain is due to spinal stenosis, degenerative disc disease, scoliosis, and sciatica. Prednisone  is prescribed for back spasms. Previous treatments included injections, but they are no longer effective. Surgery is not an option due to personal preference. Continue prednisone  as prescribed for back spasms.  Prediabetes   Dietary management includes a vegetarian diet with salads, bread, and potatoes. A1c levels will be monitored. Continue current dietary habits and monitor A1c levels.     Recommended follow up: Return in about 6 months (around 09/20/2024) for chronic follow-up,  AWV tina soon.  Lab/Order associations: fasting  Jenkins CHRISTELLA Carrel, MD

## 2024-03-23 NOTE — Progress Notes (Signed)
 Labs look good

## 2024-05-02 ENCOUNTER — Ambulatory Visit (INDEPENDENT_AMBULATORY_CARE_PROVIDER_SITE_OTHER)

## 2024-05-02 VITALS — BP 134/70 | HR 76 | Ht 59.0 in | Wt 147.0 lb

## 2024-05-02 DIAGNOSIS — Z0000 Encounter for general adult medical examination without abnormal findings: Secondary | ICD-10-CM

## 2024-05-02 NOTE — Patient Instructions (Signed)
 Ariel Cook,  Thank you for taking the time for your Medicare Wellness Visit. I appreciate your continued commitment to your health goals. Please review the care plan we discussed, and feel free to reach out if I can assist you further.  Please note that Annual Wellness Visits do not include a physical exam. Some assessments may be limited, especially if the visit was conducted virtually. If needed, we may recommend an in-person follow-up with your provider.  Ongoing Care Seeing your primary care provider every 3 to 6 months helps us  monitor your health and provide consistent, personalized care.   Referrals If a referral was made during today's visit and you haven't received any updates within two weeks, please contact the referred provider directly to check on the status.  Recommended Screenings:  Health Maintenance  Topic Date Due   DTaP/Tdap/Td vaccine (2 - Tdap) 03/23/2025*   Pneumococcal Vaccine for age over 38 (2 of 2 - PPSV23, PCV20, or PCV21) 03/23/2025*   COVID-19 Vaccine (6 - 2025-26 season) 08/01/2024   Medicare Annual Wellness Visit  05/02/2025   Colon Cancer Screening  04/04/2027   Flu Shot  Completed   Osteoporosis screening with Bone Density Scan  Completed   Hepatitis C Screening  Completed   Zoster (Shingles) Vaccine  Completed   Meningitis B Vaccine  Aged Out   Breast Cancer Screening  Discontinued  *Topic was postponed. The date shown is not the original due date.       04/29/2024    2:48 PM  Advanced Directives  Does Patient Have a Medical Advance Directive? Yes  Type of Advance Directive Healthcare Power of Attorney  Copy of Healthcare Power of Attorney in Chart? No - copy requested    Vision: Annual vision screenings are recommended for early detection of glaucoma, cataracts, and diabetic retinopathy. These exams can also reveal signs of chronic conditions such as diabetes and high blood pressure.  Dental: Annual dental screenings help detect early signs  of oral cancer, gum disease, and other conditions linked to overall health, including heart disease and diabetes.  Please see the attached documents for additional preventive care recommendations.

## 2024-05-02 NOTE — Progress Notes (Signed)
 "  Chief Complaint  Patient presents with   Medicare Wellness     Subjective:   Ariel Cook is a 78 y.o. female who presents for a Medicare Annual Wellness Visit.  Visit info / Clinical Intake: Medicare Wellness Visit Type:: Subsequent Annual Wellness Visit Persons participating in visit and providing information:: patient Medicare Wellness Visit Mode:: Telephone If telephone:: video declined Since this visit was completed virtually, some vitals may be partially provided or unavailable. Missing vitals are due to the limitations of the virtual format.: Documented vitals are patient reported If Telephone or Video please confirm:: I connected with patient using audio/video enable telemedicine. I verified patient identity with two identifiers, discussed telehealth limitations, and patient agreed to proceed. Patient Location:: home Provider Location:: home office Interpreter Needed?: No Pre-visit prep was completed: yes AWV questionnaire completed by patient prior to visit?: yes Date:: 04/29/24 Living arrangements:: (!) (Patient-Rptd) lives alone Patient's Overall Health Status Rating: (!) (Patient-Rptd) fair Typical amount of pain: (Patient-Rptd) some Does pain affect daily life?: (!) (Patient-Rptd) yes Are you currently prescribed opioids?: no  Dietary Habits and Nutritional Risks How many meals a day?: (Patient-Rptd) 2 Eats fruit and vegetables daily?: (!) (Patient-Rptd) no Most meals are obtained by: (Patient-Rptd) preparing own meals In the last 2 weeks, have you had any of the following?: none Diabetic:: no  Functional Status Activities of Daily Living (to include ambulation/medication): (Patient-Rptd) Independent Ambulation: Independent with device- listed below Home Assistive Devices/Equipment: Eyeglasses Medication Administration: (Patient-Rptd) Independent Home Management (perform basic housework or laundry): (Patient-Rptd) Independent Manage your own finances?:  (Patient-Rptd) yes Primary transportation is: (Patient-Rptd) driving Concerns about vision?: no *vision screening is required for WTM* Concerns about hearing?: (!) yes Uses hearing aids?: (!) yes Hear whispered voice?: yes  Fall Screening Falls in the past year?: (Patient-Rptd) 1 Number of falls in past year: (Patient-Rptd) 0 Was there an injury with Fall?: (Patient-Rptd) 0 Fall Risk Category Calculator: (Patient-Rptd) 1 Patient Fall Risk Level: (Patient-Rptd) Low Fall Risk  Fall Risk Patient at Risk for Falls Due to: History of fall(s) Fall risk Follow up: Falls evaluation completed  Home and Transportation Safety: All rugs have non-skid backing?: (Patient-Rptd) yes All stairs or steps have railings?: (Patient-Rptd) yes Grab bars in the bathtub or shower?: (!) (Patient-Rptd) no Have non-skid surface in bathtub or shower?: (Patient-Rptd) yes Good home lighting?: (Patient-Rptd) yes Regular seat belt use?: (Patient-Rptd) yes Hospital stays in the last year:: (Patient-Rptd) no  Cognitive Assessment Difficulty concentrating, remembering, or making decisions? : (Patient-Rptd) no Will 6CIT or Mini Cog be Completed: yes What year is it?: 0 points What month is it?: 0 points Give patient an address phrase to remember (5 components): [address] [place]  About what time is it?: 0 points Count backwards from 20 to 1: 0 points Say the months of the year in reverse: 0 points Repeat the address phrase from earlier: 0 points 6 CIT Score: 0 points  Advance Directives (For Healthcare) Does Patient Have a Medical Advance Directive?: Yes Type of Advance Directive: Healthcare Power of Attorney Copy of Healthcare Power of Attorney in Chart?: No - copy requested  Reviewed/Updated  Reviewed/Updated: Reviewed All (Medical, Surgical, Family, Medications, Allergies, Care Teams, Patient Goals)    Allergies (verified) Sulfa antibiotics, Other, Stadol [butorphanol], and Codeine   Current  Medications (verified) Outpatient Encounter Medications as of 05/02/2024  Medication Sig   acetaminophen  (TYLENOL ) 650 MG CR tablet Take 650 mg by mouth every 8 (eight) hours as needed for pain.  albuterol  (VENTOLIN  HFA) 108 (90 Base) MCG/ACT inhaler Inhale 2 puffs into the lungs every 6 (six) hours as needed for wheezing or shortness of breath.   Biotin 89999 MCG TABS Take 10,000 mcg by mouth daily.    cyclobenzaprine  (FLEXERIL ) 10 MG tablet Take 10 mg by mouth 2 (two) times daily as needed.   escitalopram  (LEXAPRO ) 20 MG tablet Take 1 tablet (20 mg total) by mouth daily.   Fluticasone  Furoate (ARNUITY ELLIPTA ) 100 MCG/ACT AEPB Inhale 1 Inhalation into the lungs daily at 12 noon.   ibuprofen (ADVIL) 200 MG tablet Take 600 mg by mouth every 6 (six) hours as needed.   lidocaine  (LIDODERM ) 5 % Place 1 patch onto the skin daily. Remove & Discard patch within 12 hours or as directed by MD   Multiple Vitamins-Minerals (PRESERVISION AREDS 2) CAPS SMARTSIG:1 By Mouth   Polyvinyl Alcohol-Povidone PF (REFRESH) 1.4-0.6 % SOLN Place 1 drop into both eyes daily as needed (Dry eye).   triamterene -hydrochlorothiazide (DYAZIDE) 37.5-25 MG capsule Take 1 each (1 capsule total) by mouth every morning.   No facility-administered encounter medications on file as of 05/02/2024.    History: Past Medical History:  Diagnosis Date   Allergy Always   Anemia Youth   Anxiety    Arthritis    Asthma    USES ALBUTEROL  INHALER   Cataract 2011   Surgery   DDD (degenerative disc disease), cervical    Depression Chronic   GERD (gastroesophageal reflux disease) Ongoing   Headache(784.0)    Left carpal tunnel syndrome 09/03/2011   Macular degeneration    Osteoporosis    PONV (postoperative nausea and vomiting)    Sciatica    Unspecified adverse effect of anesthesia    CAUSES ASTHMA TO EXACERBATE, CAUSES MIGRAINES   Past Surgical History:  Procedure Laterality Date   APPENDECTOMY  2010   CARPAL TUNNEL  RELEASE  2010   RT   CARPAL TUNNEL RELEASE  09/03/2011   Procedure: CARPAL TUNNEL RELEASE;  Surgeon: Fonda SHAUNNA Olmsted, MD;  Location: [facility];  Service: Orthopedics;  Laterality: Left;   COLONOSCOPY N/A 04/03/2020   Procedure: COLONOSCOPY;  Surgeon: Golda Claudis PENNER, MD;  Location: [facility];  Service: Endoscopy;  Laterality: N/A;  730, per office moved to 12/2 @ 8:30   EYE SURGERY  2011   BIL CATARACT   POLYPECTOMY  04/03/2020   Procedure: POLYPECTOMY INTESTINAL;  Surgeon: Golda Claudis PENNER, MD;  Location: [facility];  Service: Endoscopy;;   TUBAL LIGATION  1976   Family History  Problem Relation Age of Onset   Heart disease Mother    COPD Mother    Glaucoma Mother    Hypertension Mother    Heart attack Father    Arthritis Father    COPD Father    Heart disease Father    Hypertension Father    Stroke Sister    Intellectual disability Sister    Hypertension Sister    Hyperlipidemia Sister    Early death Sister    Drug abuse Sister    COPD Sister    Arthritis Sister    Retinal detachment Sister    Diabetes Sister    Breast cancer Sister    Cancer Sister 46       breast   Depression Sister    Kidney disease Sister    Obesity Sister    Cancer Sister 56       breast   Depression Sister    Stroke  Sister    Learning disabilities Son    Birth defects Son    Intellectual disability Son    Mental retardation Son    Diabetes Maternal Aunt    Diabetes Maternal Uncle    Hyperlipidemia Maternal Grandmother    Depression Maternal Grandmother    Arthritis Maternal Grandmother    Varicose Veins Maternal Grandmother    Birth defects Maternal Grandfather    Arthritis Maternal Grandfather    Hyperlipidemia Paternal Grandmother    Heart disease Paternal Grandmother    Arthritis Paternal Grandmother    Arthritis Paternal Grandfather    Vision loss Paternal Grandfather    Social History   Occupational History   Not on file  Tobacco Use   Smoking status:  Never   Smokeless tobacco: Never  Vaping Use   Vaping status: Never Used  Substance and Sexual Activity   Alcohol use: Never   Drug use: Never   Sexual activity: Not Currently   Tobacco Counseling Counseling given: Not Answered  SDOH Screenings   Food Insecurity: No Food Insecurity (05/02/2024)  Housing: Low Risk (05/02/2024)  Transportation Needs: No Transportation Needs (05/02/2024)  Utilities: Not At Risk (05/02/2024)  Depression (PHQ2-9): Low Risk (05/02/2024)  Recent Concern: Depression (PHQ2-9) - Medium Risk (03/23/2024)  Financial Resource Strain: Low Risk (03/21/2024)  Physical Activity: Inactive (05/02/2024)  Social Connections: Moderately Integrated (05/02/2024)  Stress: Stress Concern Present (05/02/2024)  Tobacco Use: Low Risk (05/02/2024)  Health Literacy: Adequate Health Literacy (05/02/2024)   See flowsheets for full screening details  Depression Screen PHQ 2 & 9 Depression Scale- Over the past 2 weeks, how often have you been bothered by any of the following problems? Little interest or pleasure in doing things: 0 Feeling down, depressed, or hopeless (PHQ Adolescent also includes...irritable): 0 PHQ-2 Total Score: 0 Trouble falling or staying asleep, or sleeping too much: 0 Feeling tired or having little energy: 0 Poor appetite or overeating (PHQ Adolescent also includes...weight loss): 0 Feeling bad about yourself - or that you are a failure or have let yourself or your family down: 0 Trouble concentrating on things, such as reading the newspaper or watching television (PHQ Adolescent also includes...like school work): 0 Moving or speaking so slowly that other people could have noticed. Or the opposite - being so fidgety or restless that you have been moving around a lot more than usual: 0 Thoughts that you would be better off dead, or of hurting yourself in some way: 0 PHQ-9 Total Score: 0 If you checked off any problems, how difficult have these problems  made it for you to do your work, take care of things at home, or get along with other people?: Not difficult at all  Depression Treatment Depression Interventions/Treatment : EYV7-0 Score <4 Follow-up Not Indicated     Goals Addressed               This Visit's Progress     work on better health (pt-stated)        Work on better health             Objective:    Today's Vitals   05/02/24 1054  BP: 134/70  Pulse: 76  Weight: 147 lb (66.7 kg)  Height: 4' 11 (1.499 m)   Body mass index is 29.69 kg/m.  Hearing/Vision screen Hearing Screening - Comments:: Pt wears hearing aids  Vision Screening - Comments:: Wears rx glasses - up to date with routine eye exams with Dr Octavia  Immunizations and  Health Maintenance Health Maintenance  Topic Date Due   DTaP/Tdap/Td (2 - Tdap) 03/23/2025 (Originally 03/21/2007)   Pneumococcal Vaccine: 50+ Years (2 of 2 - PPSV23, PCV20, or PCV21) 03/23/2025 (Originally 05/04/2018)   COVID-19 Vaccine (6 - 2025-26 season) 08/01/2024   Medicare Annual Wellness (AWV)  05/02/2025   Colonoscopy  04/04/2027   Influenza Vaccine  Completed   Bone Density Scan  Completed   Hepatitis C Screening  Completed   Zoster Vaccines- Shingrix  Completed   Meningococcal B Vaccine  Aged Out   Mammogram  Discontinued        Assessment/Plan:  This is a routine wellness examination for Ariel Cook.  Patient Care Team: Wendolyn Jenkins Jansky, MD as PCP - General (Family Medicine)  I have personally reviewed and noted the following in the patients chart:   Medical and social history Use of alcohol, tobacco or illicit drugs  Current medications and supplements including opioid prescriptions. Functional ability and status Nutritional status Physical activity Advanced directives List of other physicians Hospitalizations, surgeries, and ER visits in previous 12 months Vitals Screenings to include cognitive, depression, and falls Referrals and appointments  No  orders of the defined types were placed in this encounter.  In addition, I have reviewed and discussed with patient certain preventive protocols, quality metrics, and best practice recommendations. A written personalized care plan for preventive services as well as general preventive health recommendations were provided to patient.   Ariel VEAR Haws, LPN   [id number]   Return in about 1 year (around 05/02/2025).  After Visit Summary: (MyChart) Due to this being a telephonic visit, the after visit summary with patients personalized plan was offered to patient via MyChart   Nurse Notes: No voiced or noted concerns at this time  "

## 2024-09-21 ENCOUNTER — Ambulatory Visit: Admitting: Family Medicine

## 2025-03-05 ENCOUNTER — Encounter (INDEPENDENT_AMBULATORY_CARE_PROVIDER_SITE_OTHER): Admitting: Ophthalmology
